# Patient Record
Sex: Male | Born: 1935 | Race: White | Hispanic: No | Marital: Married | Smoking: Former smoker
Health system: Southern US, Community
[De-identification: ages and names within clinical notes are randomized; demographics above are authoritative.]

## PROBLEM LIST (undated history)

## (undated) DIAGNOSIS — K589 Irritable bowel syndrome without diarrhea: Secondary | ICD-10-CM

## (undated) DIAGNOSIS — K219 Gastro-esophageal reflux disease without esophagitis: Secondary | ICD-10-CM

## (undated) DIAGNOSIS — H3530 Unspecified macular degeneration: Secondary | ICD-10-CM

## (undated) DIAGNOSIS — I10 Essential (primary) hypertension: Secondary | ICD-10-CM

## (undated) DIAGNOSIS — Z973 Presence of spectacles and contact lenses: Secondary | ICD-10-CM

## (undated) DIAGNOSIS — K2270 Barrett's esophagus without dysplasia: Secondary | ICD-10-CM

## (undated) DIAGNOSIS — E039 Hypothyroidism, unspecified: Secondary | ICD-10-CM

## (undated) DIAGNOSIS — E162 Hypoglycemia, unspecified: Secondary | ICD-10-CM

## (undated) DIAGNOSIS — M720 Palmar fascial fibromatosis [Dupuytren]: Secondary | ICD-10-CM

## (undated) DIAGNOSIS — C9000 Multiple myeloma not having achieved remission: Secondary | ICD-10-CM

## (undated) DIAGNOSIS — R718 Other abnormality of red blood cells: Secondary | ICD-10-CM

## (undated) DIAGNOSIS — E538 Deficiency of other specified B group vitamins: Secondary | ICD-10-CM

## (undated) DIAGNOSIS — E785 Hyperlipidemia, unspecified: Secondary | ICD-10-CM

## (undated) HISTORY — DX: Hypoglycemia, unspecified: E16.2

## (undated) HISTORY — DX: Deficiency of other specified B group vitamins: E53.8

## (undated) HISTORY — DX: Barrett's esophagus without dysplasia: K22.70

## (undated) HISTORY — DX: Irritable bowel syndrome, unspecified: K58.9

## (undated) HISTORY — DX: Hyperlipidemia, unspecified: E78.5

## (undated) HISTORY — PX: CATARACT EXTRACTION: SUR2

## (undated) HISTORY — PX: TONSILLECTOMY: SUR1361

## (undated) HISTORY — PX: COLONOSCOPY W/ POLYPECTOMY: SHX1380

## (undated) HISTORY — DX: Other abnormality of red blood cells: R71.8

## (undated) HISTORY — PX: ESOPHAGOGASTRODUODENOSCOPY: SHX1529

## (undated) HISTORY — DX: Palmar fascial fibromatosis (dupuytren): M72.0

## (undated) HISTORY — DX: Multiple myeloma not having achieved remission: C90.00

## (undated) HISTORY — PX: SKIN CANCER EXCISION: SHX779

## (undated) HISTORY — DX: Hypothyroidism, unspecified: E03.9

## (undated) HISTORY — DX: Unspecified macular degeneration: H35.30

## (undated) HISTORY — DX: Essential (primary) hypertension: I10

## (undated) HISTORY — DX: Gastro-esophageal reflux disease without esophagitis: K21.9

---

## 2010-05-22 ENCOUNTER — Ambulatory Visit (HOSPITAL_COMMUNITY): Admission: RE | Admit: 2010-05-22 | Discharge: 2010-05-22 | Payer: Self-pay | Admitting: Gastroenterology

## 2011-07-06 ENCOUNTER — Encounter (INDEPENDENT_AMBULATORY_CARE_PROVIDER_SITE_OTHER): Payer: Medicare Other | Admitting: Ophthalmology

## 2011-07-06 DIAGNOSIS — H353290 Exudative age-related macular degeneration, unspecified eye, stage unspecified: Secondary | ICD-10-CM

## 2011-07-06 DIAGNOSIS — H43819 Vitreous degeneration, unspecified eye: Secondary | ICD-10-CM

## 2011-07-06 DIAGNOSIS — H3530 Unspecified macular degeneration: Secondary | ICD-10-CM

## 2011-08-10 ENCOUNTER — Encounter (INDEPENDENT_AMBULATORY_CARE_PROVIDER_SITE_OTHER): Payer: Medicare Other | Admitting: Ophthalmology

## 2011-08-10 DIAGNOSIS — H353290 Exudative age-related macular degeneration, unspecified eye, stage unspecified: Secondary | ICD-10-CM

## 2011-08-10 DIAGNOSIS — H3530 Unspecified macular degeneration: Secondary | ICD-10-CM

## 2011-08-10 DIAGNOSIS — H43819 Vitreous degeneration, unspecified eye: Secondary | ICD-10-CM

## 2011-09-14 ENCOUNTER — Encounter (INDEPENDENT_AMBULATORY_CARE_PROVIDER_SITE_OTHER): Payer: Medicare Other | Admitting: Ophthalmology

## 2011-09-14 DIAGNOSIS — H43819 Vitreous degeneration, unspecified eye: Secondary | ICD-10-CM

## 2011-09-14 DIAGNOSIS — H3530 Unspecified macular degeneration: Secondary | ICD-10-CM

## 2011-09-14 DIAGNOSIS — H353290 Exudative age-related macular degeneration, unspecified eye, stage unspecified: Secondary | ICD-10-CM

## 2011-10-19 ENCOUNTER — Encounter (INDEPENDENT_AMBULATORY_CARE_PROVIDER_SITE_OTHER): Payer: Medicare Other | Admitting: Ophthalmology

## 2011-10-19 DIAGNOSIS — H43819 Vitreous degeneration, unspecified eye: Secondary | ICD-10-CM

## 2011-10-19 DIAGNOSIS — H353290 Exudative age-related macular degeneration, unspecified eye, stage unspecified: Secondary | ICD-10-CM

## 2011-10-19 DIAGNOSIS — H2510 Age-related nuclear cataract, unspecified eye: Secondary | ICD-10-CM

## 2011-10-19 DIAGNOSIS — H3530 Unspecified macular degeneration: Secondary | ICD-10-CM

## 2011-10-25 ENCOUNTER — Telehealth: Payer: Self-pay | Admitting: Oncology

## 2011-10-25 NOTE — Telephone Encounter (Signed)
lmonvm advising the pt to call me back to comfirm an appt in dec with dr Truett Perna for a new pt consult.

## 2011-11-26 ENCOUNTER — Ambulatory Visit: Payer: Medicare Other

## 2011-11-26 ENCOUNTER — Ambulatory Visit (HOSPITAL_BASED_OUTPATIENT_CLINIC_OR_DEPARTMENT_OTHER): Payer: Medicare Other | Admitting: Oncology

## 2011-11-26 ENCOUNTER — Encounter: Payer: Self-pay | Admitting: Oncology

## 2011-11-26 ENCOUNTER — Other Ambulatory Visit: Payer: Medicare Other | Admitting: Lab

## 2011-11-26 VITALS — BP 147/64 | HR 67 | Temp 97.4°F | Ht 67.5 in | Wt 181.1 lb

## 2011-11-26 DIAGNOSIS — D7589 Other specified diseases of blood and blood-forming organs: Secondary | ICD-10-CM

## 2011-11-26 DIAGNOSIS — D472 Monoclonal gammopathy: Secondary | ICD-10-CM

## 2011-11-26 DIAGNOSIS — G5790 Unspecified mononeuropathy of unspecified lower limb: Secondary | ICD-10-CM

## 2011-11-26 LAB — CBC WITH DIFFERENTIAL/PLATELET
Basophils Absolute: 0 10*3/uL (ref 0.0–0.1)
HCT: 38 % — ABNORMAL LOW (ref 38.4–49.9)
HGB: 13.2 g/dL (ref 13.0–17.1)
MONO#: 0.9 10*3/uL (ref 0.1–0.9)
NEUT#: 3.3 10*3/uL (ref 1.5–6.5)
NEUT%: 38.5 % — ABNORMAL LOW (ref 39.0–75.0)
RDW: 13.1 % (ref 11.0–14.6)
WBC: 8.6 10*3/uL (ref 4.0–10.3)
lymph#: 4.2 10*3/uL — ABNORMAL HIGH (ref 0.9–3.3)

## 2011-11-26 LAB — COMPREHENSIVE METABOLIC PANEL
AST: 39 U/L — ABNORMAL HIGH (ref 0–37)
Albumin: 3.6 g/dL (ref 3.5–5.2)
BUN: 14 mg/dL (ref 6–23)
Calcium: 9.6 mg/dL (ref 8.4–10.5)
Chloride: 100 mEq/L (ref 96–112)
Glucose, Bld: 102 mg/dL — ABNORMAL HIGH (ref 70–99)
Potassium: 3.6 mEq/L (ref 3.5–5.3)
Sodium: 135 mEq/L (ref 135–145)
Total Protein: 8.4 g/dL — ABNORMAL HIGH (ref 6.0–8.3)

## 2011-11-26 NOTE — Progress Notes (Signed)
Referring MD:  Candis Shine 77 y.o.  02-Sep-1934    Reason for Referral: Monoclonal gammopathy     HPI: He reports being diagnosed with a monoclonal protein while living in [place] in 2008. He reports being referred to an oncologist in [place]. The monoclonal protein has been followed with an observation approach. He has not undergone a diagnostic bone marrow biopsy.  He reports feeling well. We saw Dr. Pete Glatter on 10/13/2011 the serum IgG level was elevated at 2658 compared to a level of 2426 on 12/29/2010. He is referred for hematology evaluation. A skeletal survey on 01/01/2011 revealed no evidence for multiple myeloma..   Past Medical History  Diagnosis Date  . Hypertension   . Monoclonal paraproteinemia  2008  . Hyperlipemia   . GERD (gastroesophageal reflux disease)   . Irritable bowel syndrome (IBS)   . Thyroid disease     Hypothyroidism  . Dupuytren's contractures at hands    . Vitamin B12 deficiency   . Macular degeneration-maintained on Avastin therapy, right eye   . Barrett's esophagus    . History of "hypoglycemia"  . "Neuropathy" effecting the feet  . History of vitamin B 12 deficiency-maintained on B12 replacement  . History of "ventricular tachycardia" Past Surgical History  Procedure Date  . Skin cancer excision     Left ear-basal cell carcinoma   . Tonsillectomy     age 38 1/2  . Colonoscopy w/ polypectomy     at 77yo---benign    Family history: His father and a brother had lung cancer. They were smokers. A maternal on had lymphoma. 2 maternal uncles had pancreatic cancer. His mother is alive at age 60.   Current outpatient prescriptions:aspirin 81 MG tablet, Take 81 mg by mouth daily.  , Disp: , Rfl: ;  atenolol (TENORMIN) 50 MG tablet, Take 50 mg by mouth daily.  , Disp: , Rfl: ;  cyanocobalamin 2000 MCG tablet, Take 2,000 mcg by mouth daily.  , Disp: , Rfl: ;  fish oil-omega-3 fatty acids 1000 MG capsule, Take 1 g by mouth daily.  ,  Disp: , Rfl: ;  hydrochlorothiazide (HYDRODIURIL) 12.5 MG tablet, Take 12.5 mg by mouth daily.  , Disp: , Rfl:  hyoscyamine (LEVSIN, ANASPAZ) 0.125 MG tablet, Take 0.125 mg by mouth every 6 (six) hours as needed.  , Disp: , Rfl: ;  ibuprofen (ADVIL,MOTRIN) 800 MG tablet, Take 800 mg by mouth as needed.  , Disp: , Rfl: ;  levothyroxine (SYNTHROID, LEVOTHROID) 150 MCG tablet, Take 150 mcg by mouth daily.  , Disp: , Rfl: ;  mineral oil liquid, Place 30 mLs in ear(s) once a week. Right ear , Disp: , Rfl:  mometasone (NASONEX) 50 MCG/ACT nasal spray, Place 2 sprays into the nose daily. 50MCG/ACT Each nostril , Disp: , Rfl: ;  Multiple Vitamins-Minerals (PRESERVISION AREDS PO), Take 1 capsule by mouth daily.  , Disp: , Rfl: ;  pantoprazole (PROTONIX) 40 MG tablet, Take 40 mg by mouth daily.  , Disp: , Rfl: ;  simvastatin (ZOCOR) 20 MG tablet, Take 20 mg by mouth at bedtime.  , Disp: , Rfl:   Allergies: No Known Allergies  Social History: He lives with his wife in [place]. He is retired after working for [employer]. He quit smoking cigarettes in 1989. He has 2-3 lyquor drinks per day. 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 He knows of no unusual exposures. He has no transfusion history. He denies risk factors for HIV and  hepatitis.   ROS:   Positives include: Numbness and burning in the feet for the past 4-5 years, decreased vision in the right eye secondary to macular degeneration-improved with Avastin, "seborrhea "at the head-improved following a "peel "procedure, Dupuytren's contractures at the hands bilaterally.  A complete ROS was otherwise negative.  Physical Exam:  Blood pressure 147/64, pulse 67, temperature 97.4 F (36.3 C), temperature source Oral, height 5' 7.5" (1.715 m), weight 181 lb 1.6 oz (82.146 kg).  HEENT: Oropharynx without visible mass, neck without mass Lungs:  Clear bilaterally Cardiac: Regular rate and rhythm Abdomen: No hepatosplenomegaly. Nontender. No mass. GU: The testes  appear atrophied. No mass.  Vascular: Chronic stasis change at the low leg bilaterally with Brown discoloration at the pretibial areas on the left greater than right Lymph nodes: No cervical, supraclavicular, axillary, or inguinal lymph node Neurologic: Alert and oriented. The motor exam appears grossly intact throughout. The deep tendon reflexes are 2+ and symmetric at the biceps and knee Skin: Few telangiectasias at the upper chest. Musculoskeletal: Contractures at several of the finger joints bilaterally. Breasts: Bilateral gynecomastia   LAB:  CBC  Lab Results  Component Value Date   WBC 8.6 11/26/2011   HGB 13.2 11/26/2011   HCT 38.0* 11/26/2011   MCV 101.4* 11/26/2011   PLT 186 11/26/2011   review of the peripheral blood smear: The platelets appear normal in number. The white cells are mature and there is no mild not in this population. I see no circulating plasma cells. There is a moderate degree of variation in red cell size.  CMP      Component Value Date/Time   NA 135 11/26/2011 1525   K 3.6 11/26/2011 1525   CL 100 11/26/2011 1525   CO2 28 11/26/2011 1525   GLUCOSE 102* 11/26/2011 1525   BUN 14 11/26/2011 1525   CREATININE 0.83 11/26/2011 1525   CALCIUM 9.6 11/26/2011 1525   PROT 8.4* 11/26/2011 1525   ALBUMIN 3.6 11/26/2011 1525   AST 39* 11/26/2011 1525   ALT 31 11/26/2011 1525   ALKPHOS 55 11/26/2011 1525   BILITOT 0.6 11/26/2011 1525      Assessment/Plan:   1. Elevated IgG level with a monoclonal protein demonstrated on previous serum protein electrophoresis studies.  2. Red cell macrocytosis  3. Moderate alcohol use  4. "Neuropathy "of the feet   Disposition:   Mr. Olivar is referred for evaluation of a serum monoclonal protein. He reports this has been noted since 2008. I suspect the protein represents a monoclonal gammopathy of unknown significance versus "smoldering" multiple myeloma. There is no clinical evidence of a lymphoproliferative  disorder.  The lack of anemia, the normal IgM/IgA levels on recent lab study, and the negative skeletal survey argue against a diagnosis of multiple myeloma.  The "neuropathy "is most likely related to a benign condition, but myeloma-related neuropathy should be considered if this progresses.  The Red cell macrocytosis could be related to a plasma cell dyscrasia, but I think this is more likely due to alcohol use. He has physical exam findings today that may suggest underlying liver disease.  We obtained a serum protein electrophoresis/immunofixation and serum free light chain analysis today. I do not recommend a bone marrow biopsy at present. He would like to continue clinical followup with Dr. Pete Glatter. I recommend Dr. Pete Glatter obtain a a CBC, chemistry panel, and serum protein electrophoresis approximately every 6 months. Mr. Mcelrath understands he could develop multiple myeloma in the future. I will be  glad to see him back if he develops a progressive rise in the serum M spike or new clinical/laboratory findings to suggest multiple myeloma.    Ceazia Harb BRADLEY 11/26/2011, 5:38 PM

## 2011-11-30 ENCOUNTER — Encounter (INDEPENDENT_AMBULATORY_CARE_PROVIDER_SITE_OTHER): Payer: Medicare Other | Admitting: Ophthalmology

## 2011-11-30 LAB — SPEP & IFE WITH QIG
Albumin ELP: 49.7 % — ABNORMAL LOW (ref 55.8–66.1)
Alpha-1-Globulin: 3.5 % (ref 2.9–4.9)
Alpha-2-Globulin: 9.6 % (ref 7.1–11.8)
Gamma Globulin: 28.2 % — ABNORMAL HIGH (ref 11.1–18.8)
IgM, Serum: 121 mg/dL (ref 41–251)

## 2011-11-30 LAB — KAPPA/LAMBDA LIGHT CHAINS
Kappa:Lambda Ratio: 2.08 — ABNORMAL HIGH (ref 0.26–1.65)
Lambda Free Lght Chn: 1.21 mg/dL (ref 0.57–2.63)

## 2011-12-02 ENCOUNTER — Telehealth: Payer: Self-pay | Admitting: *Deleted

## 2011-12-02 NOTE — Telephone Encounter (Signed)
Received call from pt reporting he was called by this office to schedule labs and radiation. There is no notation of this call in pt's chart. There is no indication for XRT for this pt. Informed pt that Dr. Truett Perna suggests observation and labs every six months in his case. He can have these labs done with Dr. Pete Glatter. Pt verbalized understanding.

## 2011-12-03 ENCOUNTER — Encounter (INDEPENDENT_AMBULATORY_CARE_PROVIDER_SITE_OTHER): Payer: Medicare Other | Admitting: Ophthalmology

## 2011-12-03 DIAGNOSIS — H43819 Vitreous degeneration, unspecified eye: Secondary | ICD-10-CM

## 2011-12-03 DIAGNOSIS — H353290 Exudative age-related macular degeneration, unspecified eye, stage unspecified: Secondary | ICD-10-CM

## 2011-12-03 DIAGNOSIS — H2510 Age-related nuclear cataract, unspecified eye: Secondary | ICD-10-CM

## 2011-12-03 DIAGNOSIS — H3530 Unspecified macular degeneration: Secondary | ICD-10-CM

## 2012-01-14 ENCOUNTER — Encounter (INDEPENDENT_AMBULATORY_CARE_PROVIDER_SITE_OTHER): Payer: Medicare Other | Admitting: Ophthalmology

## 2012-01-14 DIAGNOSIS — H43819 Vitreous degeneration, unspecified eye: Secondary | ICD-10-CM

## 2012-01-14 DIAGNOSIS — H3530 Unspecified macular degeneration: Secondary | ICD-10-CM

## 2012-01-14 DIAGNOSIS — H353290 Exudative age-related macular degeneration, unspecified eye, stage unspecified: Secondary | ICD-10-CM

## 2012-01-14 DIAGNOSIS — H2510 Age-related nuclear cataract, unspecified eye: Secondary | ICD-10-CM

## 2012-03-03 ENCOUNTER — Encounter (INDEPENDENT_AMBULATORY_CARE_PROVIDER_SITE_OTHER): Payer: Medicare Other | Admitting: Ophthalmology

## 2012-03-03 DIAGNOSIS — H43819 Vitreous degeneration, unspecified eye: Secondary | ICD-10-CM

## 2012-03-03 DIAGNOSIS — H2510 Age-related nuclear cataract, unspecified eye: Secondary | ICD-10-CM

## 2012-03-03 DIAGNOSIS — H3530 Unspecified macular degeneration: Secondary | ICD-10-CM

## 2012-03-03 DIAGNOSIS — H353290 Exudative age-related macular degeneration, unspecified eye, stage unspecified: Secondary | ICD-10-CM

## 2012-04-21 ENCOUNTER — Encounter (INDEPENDENT_AMBULATORY_CARE_PROVIDER_SITE_OTHER): Payer: Medicare Other | Admitting: Ophthalmology

## 2012-04-21 DIAGNOSIS — H43819 Vitreous degeneration, unspecified eye: Secondary | ICD-10-CM

## 2012-04-21 DIAGNOSIS — H2510 Age-related nuclear cataract, unspecified eye: Secondary | ICD-10-CM

## 2012-04-21 DIAGNOSIS — H3530 Unspecified macular degeneration: Secondary | ICD-10-CM

## 2012-04-21 DIAGNOSIS — H353290 Exudative age-related macular degeneration, unspecified eye, stage unspecified: Secondary | ICD-10-CM

## 2012-06-02 ENCOUNTER — Encounter (INDEPENDENT_AMBULATORY_CARE_PROVIDER_SITE_OTHER): Payer: Medicare Other | Admitting: Ophthalmology

## 2012-06-02 DIAGNOSIS — H2510 Age-related nuclear cataract, unspecified eye: Secondary | ICD-10-CM

## 2012-06-02 DIAGNOSIS — H43819 Vitreous degeneration, unspecified eye: Secondary | ICD-10-CM

## 2012-06-02 DIAGNOSIS — H3530 Unspecified macular degeneration: Secondary | ICD-10-CM

## 2012-06-02 DIAGNOSIS — H353290 Exudative age-related macular degeneration, unspecified eye, stage unspecified: Secondary | ICD-10-CM

## 2012-07-14 ENCOUNTER — Encounter (INDEPENDENT_AMBULATORY_CARE_PROVIDER_SITE_OTHER): Payer: Medicare Other | Admitting: Ophthalmology

## 2012-07-14 DIAGNOSIS — H4320 Crystalline deposits in vitreous body, unspecified eye: Secondary | ICD-10-CM

## 2012-07-14 DIAGNOSIS — H3530 Unspecified macular degeneration: Secondary | ICD-10-CM

## 2012-07-14 DIAGNOSIS — H43819 Vitreous degeneration, unspecified eye: Secondary | ICD-10-CM

## 2012-07-14 DIAGNOSIS — H353290 Exudative age-related macular degeneration, unspecified eye, stage unspecified: Secondary | ICD-10-CM

## 2012-07-14 DIAGNOSIS — H35039 Hypertensive retinopathy, unspecified eye: Secondary | ICD-10-CM

## 2012-07-14 DIAGNOSIS — I10 Essential (primary) hypertension: Secondary | ICD-10-CM

## 2012-07-14 DIAGNOSIS — H2510 Age-related nuclear cataract, unspecified eye: Secondary | ICD-10-CM

## 2012-08-25 ENCOUNTER — Encounter (INDEPENDENT_AMBULATORY_CARE_PROVIDER_SITE_OTHER): Payer: Medicare Other | Admitting: Ophthalmology

## 2012-08-25 DIAGNOSIS — H2510 Age-related nuclear cataract, unspecified eye: Secondary | ICD-10-CM

## 2012-08-25 DIAGNOSIS — H3530 Unspecified macular degeneration: Secondary | ICD-10-CM

## 2012-08-25 DIAGNOSIS — H35039 Hypertensive retinopathy, unspecified eye: Secondary | ICD-10-CM

## 2012-08-25 DIAGNOSIS — H43819 Vitreous degeneration, unspecified eye: Secondary | ICD-10-CM

## 2012-08-25 DIAGNOSIS — H353290 Exudative age-related macular degeneration, unspecified eye, stage unspecified: Secondary | ICD-10-CM

## 2012-08-25 DIAGNOSIS — I10 Essential (primary) hypertension: Secondary | ICD-10-CM

## 2012-09-19 ENCOUNTER — Other Ambulatory Visit: Payer: Self-pay | Admitting: Dermatology

## 2012-10-07 ENCOUNTER — Encounter (INDEPENDENT_AMBULATORY_CARE_PROVIDER_SITE_OTHER): Payer: Medicare Other | Admitting: Ophthalmology

## 2012-10-07 DIAGNOSIS — H3530 Unspecified macular degeneration: Secondary | ICD-10-CM

## 2012-10-07 DIAGNOSIS — H2510 Age-related nuclear cataract, unspecified eye: Secondary | ICD-10-CM

## 2012-10-07 DIAGNOSIS — I10 Essential (primary) hypertension: Secondary | ICD-10-CM

## 2012-10-07 DIAGNOSIS — H353290 Exudative age-related macular degeneration, unspecified eye, stage unspecified: Secondary | ICD-10-CM

## 2012-10-07 DIAGNOSIS — H35039 Hypertensive retinopathy, unspecified eye: Secondary | ICD-10-CM

## 2012-10-07 DIAGNOSIS — H43819 Vitreous degeneration, unspecified eye: Secondary | ICD-10-CM

## 2012-11-25 ENCOUNTER — Encounter (INDEPENDENT_AMBULATORY_CARE_PROVIDER_SITE_OTHER): Payer: Medicare Other | Admitting: Ophthalmology

## 2012-11-28 ENCOUNTER — Encounter (INDEPENDENT_AMBULATORY_CARE_PROVIDER_SITE_OTHER): Payer: Medicare Other | Admitting: Ophthalmology

## 2012-11-28 DIAGNOSIS — H353290 Exudative age-related macular degeneration, unspecified eye, stage unspecified: Secondary | ICD-10-CM

## 2012-11-28 DIAGNOSIS — H35039 Hypertensive retinopathy, unspecified eye: Secondary | ICD-10-CM

## 2012-11-28 DIAGNOSIS — H2510 Age-related nuclear cataract, unspecified eye: Secondary | ICD-10-CM

## 2012-11-28 DIAGNOSIS — H43819 Vitreous degeneration, unspecified eye: Secondary | ICD-10-CM

## 2012-11-28 DIAGNOSIS — I10 Essential (primary) hypertension: Secondary | ICD-10-CM

## 2012-11-28 DIAGNOSIS — H3530 Unspecified macular degeneration: Secondary | ICD-10-CM

## 2013-01-16 ENCOUNTER — Encounter (INDEPENDENT_AMBULATORY_CARE_PROVIDER_SITE_OTHER): Payer: Medicare Other | Admitting: Ophthalmology

## 2013-01-16 DIAGNOSIS — H3530 Unspecified macular degeneration: Secondary | ICD-10-CM

## 2013-01-16 DIAGNOSIS — H2510 Age-related nuclear cataract, unspecified eye: Secondary | ICD-10-CM

## 2013-01-16 DIAGNOSIS — H353290 Exudative age-related macular degeneration, unspecified eye, stage unspecified: Secondary | ICD-10-CM

## 2013-01-16 DIAGNOSIS — I10 Essential (primary) hypertension: Secondary | ICD-10-CM

## 2013-01-16 DIAGNOSIS — H43819 Vitreous degeneration, unspecified eye: Secondary | ICD-10-CM

## 2013-01-16 DIAGNOSIS — H35039 Hypertensive retinopathy, unspecified eye: Secondary | ICD-10-CM

## 2013-03-06 ENCOUNTER — Encounter (INDEPENDENT_AMBULATORY_CARE_PROVIDER_SITE_OTHER): Payer: Medicare Other | Admitting: Ophthalmology

## 2013-03-06 DIAGNOSIS — I10 Essential (primary) hypertension: Secondary | ICD-10-CM

## 2013-03-06 DIAGNOSIS — H43819 Vitreous degeneration, unspecified eye: Secondary | ICD-10-CM

## 2013-03-06 DIAGNOSIS — H3530 Unspecified macular degeneration: Secondary | ICD-10-CM

## 2013-03-06 DIAGNOSIS — H35039 Hypertensive retinopathy, unspecified eye: Secondary | ICD-10-CM

## 2013-03-06 DIAGNOSIS — H353290 Exudative age-related macular degeneration, unspecified eye, stage unspecified: Secondary | ICD-10-CM

## 2013-04-13 ENCOUNTER — Encounter (INDEPENDENT_AMBULATORY_CARE_PROVIDER_SITE_OTHER): Payer: Medicare Other | Admitting: Ophthalmology

## 2013-04-13 DIAGNOSIS — H35039 Hypertensive retinopathy, unspecified eye: Secondary | ICD-10-CM

## 2013-04-13 DIAGNOSIS — H353290 Exudative age-related macular degeneration, unspecified eye, stage unspecified: Secondary | ICD-10-CM

## 2013-04-13 DIAGNOSIS — I10 Essential (primary) hypertension: Secondary | ICD-10-CM

## 2013-04-13 DIAGNOSIS — H43819 Vitreous degeneration, unspecified eye: Secondary | ICD-10-CM

## 2013-04-13 DIAGNOSIS — H3530 Unspecified macular degeneration: Secondary | ICD-10-CM

## 2013-04-13 DIAGNOSIS — H2510 Age-related nuclear cataract, unspecified eye: Secondary | ICD-10-CM

## 2013-05-23 ENCOUNTER — Encounter (INDEPENDENT_AMBULATORY_CARE_PROVIDER_SITE_OTHER): Payer: Medicare Other | Admitting: Ophthalmology

## 2013-05-24 ENCOUNTER — Encounter (INDEPENDENT_AMBULATORY_CARE_PROVIDER_SITE_OTHER): Payer: Medicare Other | Admitting: Ophthalmology

## 2013-05-24 DIAGNOSIS — H3530 Unspecified macular degeneration: Secondary | ICD-10-CM

## 2013-05-24 DIAGNOSIS — I10 Essential (primary) hypertension: Secondary | ICD-10-CM

## 2013-05-24 DIAGNOSIS — H353290 Exudative age-related macular degeneration, unspecified eye, stage unspecified: Secondary | ICD-10-CM

## 2013-05-24 DIAGNOSIS — H43819 Vitreous degeneration, unspecified eye: Secondary | ICD-10-CM

## 2013-05-24 DIAGNOSIS — H35039 Hypertensive retinopathy, unspecified eye: Secondary | ICD-10-CM

## 2013-05-24 DIAGNOSIS — H2510 Age-related nuclear cataract, unspecified eye: Secondary | ICD-10-CM

## 2013-05-25 ENCOUNTER — Encounter (INDEPENDENT_AMBULATORY_CARE_PROVIDER_SITE_OTHER): Payer: Medicare Other | Admitting: Ophthalmology

## 2013-06-27 ENCOUNTER — Ambulatory Visit (HOSPITAL_BASED_OUTPATIENT_CLINIC_OR_DEPARTMENT_OTHER): Payer: Medicare Other | Admitting: Oncology

## 2013-06-27 VITALS — BP 149/48 | HR 64 | Temp 98.1°F | Resp 20 | Ht 67.5 in | Wt 181.7 lb

## 2013-06-27 DIAGNOSIS — D472 Monoclonal gammopathy: Secondary | ICD-10-CM

## 2013-06-27 NOTE — Progress Notes (Signed)
   [facility]    OFFICE PROGRESS NOTE   INTERVAL HISTORY:   [name] has been followed by Dr. Pete Glatter for the serum monoclonal protein. On 05/24/2013 the IgG returned more elevated at 3747. He is referred for hematology evaluation.  He reports feeling well. No recent infection. No pain. He exercises regularly. Good appetite.  Objective:  Vital signs in last 24 hours:  Blood pressure 149/48, pulse 64, temperature 98.1 F (36.7 C), temperature source Oral, resp. rate 20, height 5' 7.5" (1.715 m), weight 181 lb 11.2 oz (82.419 kg).    HEENT: Neck without mass Lymphatics: No cervical, supra-clavicular, axillary, or inguinal node Resp: Lungs clear bilaterally Cardio: Regular rate and rhythm GI: No hepatosplenomegaly Vascular: Trace edema at the left greater than right lower leg and ankle   Lab Results: 05/24/2013-chemotherapy 13, MCV 102.8, white count 7.3, lymphocytes 3.5, platelets 164,000, ANC 2.5, BUN 9, creatinine 0.71, total protein 8.5, albumin 3.7, calcium 9.0, alkaline phosphatase 41 IgA 115, IgM 104  Medications: I have reviewed the patient's current medications.  Assessment/Plan: 1. Elevated IgG level with a monoclonal IgG kappa protein demonstrated on a serum protein electrophoresis December 2012 2. Red cell macrocytosis -likely secondary to alcohol use versus liver disease 3. Moderate alcohol use ,? Underlying liver disease 4. "Neuropathy "of the feet  5. Mildly elevated lymphocyte count-stable compared to December 2012   Disposition:  The serum IgG level was higher in June of 2014 compared to December of 2012. There is no other laboratory or clinical evidence for progression to multiple myeloma. I suspect he has a monoclonal gammopathy of unknown significance. There is no evidence of a lymphoproliferative disorder.  I do not recommend a bone marrow biopsy at present. If he develops anemia, hypercalcemia, an elevated creatinine, or a  rapidly rising M spike I will recommend a bone marrow biopsy and metastatic bone survey.  Mr. Pen would like to continue followup with Dr. Pete Glatter. I recommend a repeat IgG level, serum protein electrophoresis, CBC, and chemistry panel in approximately 4 months. I will be glad to see him back if he develops a new laboratory abnormality or a rapid rise in the serum M spike (e.g. Doubling).   Thornton Papas, MD  06/27/2013  4:17 PM

## 2013-06-28 ENCOUNTER — Encounter (INDEPENDENT_AMBULATORY_CARE_PROVIDER_SITE_OTHER): Payer: Medicare Other | Admitting: Ophthalmology

## 2013-06-28 ENCOUNTER — Telehealth: Payer: Self-pay | Admitting: Oncology

## 2013-06-28 DIAGNOSIS — H35039 Hypertensive retinopathy, unspecified eye: Secondary | ICD-10-CM

## 2013-06-28 DIAGNOSIS — H3530 Unspecified macular degeneration: Secondary | ICD-10-CM

## 2013-06-28 DIAGNOSIS — I10 Essential (primary) hypertension: Secondary | ICD-10-CM

## 2013-06-28 DIAGNOSIS — H353290 Exudative age-related macular degeneration, unspecified eye, stage unspecified: Secondary | ICD-10-CM

## 2013-06-28 NOTE — Telephone Encounter (Signed)
Added FS for today @ 4pm - per desk nurse pt aware.

## 2013-06-28 NOTE — Telephone Encounter (Signed)
Previous phone note entered under FS entered in error. Per 7/15 pof f/u TBA.

## 2013-08-02 ENCOUNTER — Encounter (INDEPENDENT_AMBULATORY_CARE_PROVIDER_SITE_OTHER): Payer: Medicare Other | Admitting: Ophthalmology

## 2013-08-02 DIAGNOSIS — I10 Essential (primary) hypertension: Secondary | ICD-10-CM

## 2013-08-02 DIAGNOSIS — H43819 Vitreous degeneration, unspecified eye: Secondary | ICD-10-CM

## 2013-08-02 DIAGNOSIS — H35039 Hypertensive retinopathy, unspecified eye: Secondary | ICD-10-CM

## 2013-08-02 DIAGNOSIS — H353290 Exudative age-related macular degeneration, unspecified eye, stage unspecified: Secondary | ICD-10-CM

## 2013-08-02 DIAGNOSIS — H3530 Unspecified macular degeneration: Secondary | ICD-10-CM

## 2013-09-06 ENCOUNTER — Encounter (INDEPENDENT_AMBULATORY_CARE_PROVIDER_SITE_OTHER): Payer: Medicare Other | Admitting: Ophthalmology

## 2013-09-06 DIAGNOSIS — I10 Essential (primary) hypertension: Secondary | ICD-10-CM

## 2013-09-06 DIAGNOSIS — H3530 Unspecified macular degeneration: Secondary | ICD-10-CM

## 2013-09-06 DIAGNOSIS — H353290 Exudative age-related macular degeneration, unspecified eye, stage unspecified: Secondary | ICD-10-CM

## 2013-09-06 DIAGNOSIS — H35039 Hypertensive retinopathy, unspecified eye: Secondary | ICD-10-CM

## 2013-09-06 DIAGNOSIS — H43819 Vitreous degeneration, unspecified eye: Secondary | ICD-10-CM

## 2013-10-11 ENCOUNTER — Encounter (INDEPENDENT_AMBULATORY_CARE_PROVIDER_SITE_OTHER): Payer: Medicare Other | Admitting: Ophthalmology

## 2013-10-11 DIAGNOSIS — I10 Essential (primary) hypertension: Secondary | ICD-10-CM

## 2013-10-11 DIAGNOSIS — H43819 Vitreous degeneration, unspecified eye: Secondary | ICD-10-CM

## 2013-10-11 DIAGNOSIS — H353290 Exudative age-related macular degeneration, unspecified eye, stage unspecified: Secondary | ICD-10-CM

## 2013-10-11 DIAGNOSIS — H3530 Unspecified macular degeneration: Secondary | ICD-10-CM

## 2013-10-11 DIAGNOSIS — H35039 Hypertensive retinopathy, unspecified eye: Secondary | ICD-10-CM

## 2013-10-11 DIAGNOSIS — H2510 Age-related nuclear cataract, unspecified eye: Secondary | ICD-10-CM

## 2013-11-16 ENCOUNTER — Encounter (INDEPENDENT_AMBULATORY_CARE_PROVIDER_SITE_OTHER): Payer: Medicare Other | Admitting: Ophthalmology

## 2013-11-16 DIAGNOSIS — I10 Essential (primary) hypertension: Secondary | ICD-10-CM

## 2013-11-16 DIAGNOSIS — H35039 Hypertensive retinopathy, unspecified eye: Secondary | ICD-10-CM

## 2013-11-16 DIAGNOSIS — H3530 Unspecified macular degeneration: Secondary | ICD-10-CM

## 2013-11-16 DIAGNOSIS — H353290 Exudative age-related macular degeneration, unspecified eye, stage unspecified: Secondary | ICD-10-CM

## 2013-11-27 ENCOUNTER — Other Ambulatory Visit: Payer: Self-pay | Admitting: Geriatric Medicine

## 2013-11-27 DIAGNOSIS — Z139 Encounter for screening, unspecified: Secondary | ICD-10-CM

## 2013-11-29 ENCOUNTER — Ambulatory Visit: Payer: Medicare Other

## 2013-12-18 ENCOUNTER — Ambulatory Visit
Admission: RE | Admit: 2013-12-18 | Discharge: 2013-12-18 | Disposition: A | Discharge status: Home / Self Care | Payer: Medicare Other | Source: Ambulatory Visit | Attending: Geriatric Medicine | Admitting: Geriatric Medicine

## 2013-12-18 ENCOUNTER — Other Ambulatory Visit: Payer: Self-pay | Admitting: Geriatric Medicine

## 2013-12-18 DIAGNOSIS — Z139 Encounter for screening, unspecified: Secondary | ICD-10-CM

## 2013-12-20 ENCOUNTER — Other Ambulatory Visit: Payer: Self-pay | Admitting: Geriatric Medicine

## 2013-12-20 ENCOUNTER — Ambulatory Visit
Admission: RE | Admit: 2013-12-20 | Discharge: 2013-12-20 | Disposition: A | Discharge status: Home / Self Care | Payer: Medicare Other | Source: Ambulatory Visit | Attending: Geriatric Medicine | Admitting: Geriatric Medicine

## 2013-12-20 DIAGNOSIS — R0600 Dyspnea, unspecified: Secondary | ICD-10-CM

## 2013-12-21 ENCOUNTER — Telehealth: Payer: Self-pay | Admitting: Oncology

## 2013-12-21 ENCOUNTER — Telehealth: Payer: Self-pay | Admitting: *Deleted

## 2013-12-21 NOTE — Telephone Encounter (Signed)
Labs/ office notes reviewed by Dr. Benay Spice. Order sent to schedulers for 1 month office visit. No labs needed.

## 2013-12-21 NOTE — Telephone Encounter (Signed)
MEDICAL RECORDS RECEIVED GIVEN TO DESK NURSE.

## 2013-12-21 NOTE — Telephone Encounter (Signed)
Call from [place] with Dr. Felipa Eth: Pt has been diagnosed with anemia and thrombocytopenia. Dr. Felipa Eth would like pt to see Dr. Benay Spice for evaluation. They will fax labs to office.

## 2013-12-22 ENCOUNTER — Telehealth: Payer: Self-pay | Admitting: Oncology

## 2013-12-22 NOTE — Telephone Encounter (Signed)
S/w the pt and he is aware of his appt on 12/25/2013@3 :30pm.

## 2013-12-25 ENCOUNTER — Ambulatory Visit (HOSPITAL_BASED_OUTPATIENT_CLINIC_OR_DEPARTMENT_OTHER): Payer: Medicare Other | Admitting: Oncology

## 2013-12-25 ENCOUNTER — Encounter (INDEPENDENT_AMBULATORY_CARE_PROVIDER_SITE_OTHER): Payer: Self-pay

## 2013-12-25 ENCOUNTER — Other Ambulatory Visit: Payer: Self-pay | Admitting: *Deleted

## 2013-12-25 VITALS — BP 151/71 | HR 67 | Temp 98.6°F | Resp 18 | Ht 67.5 in | Wt 182.2 lb

## 2013-12-25 DIAGNOSIS — G5790 Unspecified mononeuropathy of unspecified lower limb: Secondary | ICD-10-CM

## 2013-12-25 DIAGNOSIS — D472 Monoclonal gammopathy: Secondary | ICD-10-CM

## 2013-12-25 DIAGNOSIS — D72820 Lymphocytosis (symptomatic): Secondary | ICD-10-CM

## 2013-12-25 DIAGNOSIS — D696 Thrombocytopenia, unspecified: Secondary | ICD-10-CM

## 2013-12-25 DIAGNOSIS — D649 Anemia, unspecified: Secondary | ICD-10-CM

## 2013-12-25 DIAGNOSIS — D7589 Other specified diseases of blood and blood-forming organs: Secondary | ICD-10-CM

## 2013-12-25 NOTE — Progress Notes (Addendum)
   [facility]    OFFICE PROGRESS NOTE Feels well  INTERVAL HISTORY:   Mr. Bartling is referred back to the hematology clinic by Dr. Felipa Eth. Laboratory studies on 12/20/1998 15,000 hemoglobin mildly low at 12.9, platelets 117,000, MCV 106.6, and the lymphocyte count was mildly elevated at 4.8. A chemistry panel was unremarkable. The IgG was more elevated (I do not have this result available today).  Mr. Mozer feels well. No fever, night sweats, or anorexia. His wife has noted he has malaise. No pain. He currently has 2 liquor drinks per day. He denies neuropathy symptoms at present.  Objective:  Vital signs in last 24 hours:  Blood pressure 151/71, pulse 67, temperature 98.6 F (37 C), temperature source Oral, resp. rate 18, height 5' 7.5" (1.715 m), weight 182 lb 3.2 oz (82.645 kg).    HEENT: Neck without mass Lymphatics: No cervical, supraclavicular, axillary, or inguinal nodes Resp: Lungs clear bilaterally Cardio: Regular rate and rhythm GI: No hepatosplenomegaly Vascular: No leg edema   Lab Results:  Labs ordered for 12/27/2013   Medications: I have reviewed the patient's current medications.  Assessment/Plan: 1. Elevated IgG level with a monoclonal protein demonstrated on previous serum protein electrophoresis studies. The monoclonal gammopathy dates to 2008 2. Red cell macrocytosis  3. Moderate alcohol use  4. "Neuropathy "of the feet  5. Mild lymphocytosis    Disposition:  He is referred for hematology evaluation after developing mild anemia and thrombocytopenia in the setting of a serum monoclonal IgG protein. There is no clinical evidence of a lymphoproliferative disorder or multiple myeloma.  We will obtain additional laboratory studies to include a CBC, blood smear, serum protein electrophoresis, serum free light chain analysis, vitamin B12 level, and beta-2 microglobulin level. We will consider proceeding with  flow cytometry on the  peripheral blood or a diagnostic bone marrow biopsy pending the above results.  We will be in contact with Mr. Salehi and Dr. Felipa Eth when these results are available. I discussed the case with his daughter by telephone today. He would like to continue clinical followup with Dr. Felipa Eth. We will see him here again as indicated.   [name], MD  12/25/2013  4:03 PM   Addendum: I reviewed the peripheral blood smear from 12/27/2013-the platelet number appears normal. The red cell morphology is unremarkable. There is an increased number of lymphocytes with a few plasmacytoid lymphocytes.  I sent the peripheral blood for flow cytometry. We will wait on the remaining laboratory studies to decide on the indication for a bone marrow biopsy and followup in the hematology clinic.

## 2013-12-27 ENCOUNTER — Other Ambulatory Visit (HOSPITAL_COMMUNITY)
Admission: RE | Admit: 2013-12-27 | Discharge: 2013-12-27 | Disposition: A | Discharge status: Home / Self Care | Payer: Medicare Other | Source: Ambulatory Visit | Attending: Oncology | Admitting: Oncology

## 2013-12-27 ENCOUNTER — Other Ambulatory Visit (HOSPITAL_BASED_OUTPATIENT_CLINIC_OR_DEPARTMENT_OTHER): Payer: Medicare Other

## 2013-12-27 DIAGNOSIS — D649 Anemia, unspecified: Secondary | ICD-10-CM

## 2013-12-27 DIAGNOSIS — R7989 Other specified abnormal findings of blood chemistry: Secondary | ICD-10-CM | POA: Insufficient documentation

## 2013-12-27 DIAGNOSIS — D472 Monoclonal gammopathy: Secondary | ICD-10-CM

## 2013-12-27 DIAGNOSIS — D696 Thrombocytopenia, unspecified: Secondary | ICD-10-CM

## 2013-12-27 LAB — CBC WITH DIFFERENTIAL/PLATELET
BASO%: 0.5 % (ref 0.0–2.0)
Basophils Absolute: 0 10*3/uL (ref 0.0–0.1)
EOS%: 3 % (ref 0.0–7.0)
Eosinophils Absolute: 0.2 10*3/uL (ref 0.0–0.5)
HEMATOCRIT: 35.8 % — AB (ref 38.4–49.9)
HEMOGLOBIN: 12.6 g/dL — AB (ref 13.0–17.1)
LYMPH%: 52.9 % — AB (ref 14.0–49.0)
MCH: 37.9 pg — AB (ref 27.2–33.4)
MCHC: 35.1 g/dL (ref 32.0–36.0)
MCV: 107.9 fL — AB (ref 79.3–98.0)
MONO#: 0.9 10*3/uL (ref 0.1–0.9)
MONO%: 11.9 % (ref 0.0–14.0)
NEUT%: 31.7 % — AB (ref 39.0–75.0)
NEUTROS ABS: 2.5 10*3/uL (ref 1.5–6.5)
PLATELETS: 127 10*3/uL — AB (ref 140–400)
RBC: 3.32 10*6/uL — ABNORMAL LOW (ref 4.20–5.82)
RDW: 12.9 % (ref 11.0–14.6)
WBC: 7.8 10*3/uL (ref 4.0–10.3)
lymph#: 4.1 10*3/uL — ABNORMAL HIGH (ref 0.9–3.3)

## 2013-12-27 LAB — CHCC SMEAR

## 2013-12-27 LAB — TECHNOLOGIST REVIEW

## 2013-12-28 ENCOUNTER — Other Ambulatory Visit: Payer: Self-pay | Admitting: Oncology

## 2013-12-28 DIAGNOSIS — D472 Monoclonal gammopathy: Secondary | ICD-10-CM

## 2013-12-29 LAB — KAPPA/LAMBDA LIGHT CHAINS
Kappa free light chain: 4.93 mg/dL — ABNORMAL HIGH (ref 0.33–1.94)
Kappa:Lambda Ratio: 2.99 — ABNORMAL HIGH (ref 0.26–1.65)
Lambda Free Lght Chn: 1.65 mg/dL (ref 0.57–2.63)

## 2013-12-29 LAB — VITAMIN B12: VITAMIN B 12: 431 pg/mL (ref 211–911)

## 2013-12-29 LAB — PROTEIN ELECTROPHORESIS, SERUM
ALBUMIN ELP: 45.1 % — AB (ref 55.8–66.1)
Alpha-1-Globulin: 3.3 % (ref 2.9–4.9)
Alpha-2-Globulin: 8.5 % (ref 7.1–11.8)
BETA 2: 2.8 % — AB (ref 3.2–6.5)
Beta Globulin: 4.8 % (ref 4.7–7.2)
GAMMA GLOBULIN: 35.5 % — AB (ref 11.1–18.8)
M-Spike, %: 2.66 g/dL
Total Protein, Serum Electrophoresis: 8.6 g/dL — ABNORMAL HIGH (ref 6.0–8.3)

## 2013-12-29 LAB — BETA 2 MICROGLOBULIN, SERUM: BETA 2 MICROGLOBULIN: 2.93 mg/L — AB (ref 1.01–1.73)

## 2014-01-01 ENCOUNTER — Encounter (INDEPENDENT_AMBULATORY_CARE_PROVIDER_SITE_OTHER): Payer: Medicare Other | Admitting: Ophthalmology

## 2014-01-01 DIAGNOSIS — H2510 Age-related nuclear cataract, unspecified eye: Secondary | ICD-10-CM

## 2014-01-01 DIAGNOSIS — H35039 Hypertensive retinopathy, unspecified eye: Secondary | ICD-10-CM

## 2014-01-01 DIAGNOSIS — I10 Essential (primary) hypertension: Secondary | ICD-10-CM

## 2014-01-01 DIAGNOSIS — H3530 Unspecified macular degeneration: Secondary | ICD-10-CM

## 2014-01-01 DIAGNOSIS — H353290 Exudative age-related macular degeneration, unspecified eye, stage unspecified: Secondary | ICD-10-CM

## 2014-01-01 DIAGNOSIS — H43819 Vitreous degeneration, unspecified eye: Secondary | ICD-10-CM

## 2014-01-02 ENCOUNTER — Encounter (INDEPENDENT_AMBULATORY_CARE_PROVIDER_SITE_OTHER): Payer: Medicare Other | Admitting: Ophthalmology

## 2014-01-02 LAB — FLOW CYTOMETRY

## 2014-01-03 ENCOUNTER — Telehealth: Payer: Self-pay | Admitting: *Deleted

## 2014-01-03 DIAGNOSIS — D472 Monoclonal gammopathy: Secondary | ICD-10-CM

## 2014-01-03 NOTE — Telephone Encounter (Signed)
Message copied by Tania Ade on Wed Jan 03, 2014  3:11 PM ------      Message from: [name] B      Created: Wed Jan 03, 2014  2:56 PM       Please call patient, b12 is normal, flow cytometry is negative for lymphoma or leukemia, m-spike and light chains slightly higher than 6 mo. Ago.            Recommend repeat spep,light chains, cbc, cmet and office 21months.       Copy labs to Dr. Felipa Eth ------

## 2014-01-03 NOTE — Telephone Encounter (Signed)
Pt returned call, lab results given. Dr. Benay Spice recommends repeat labs and office visit in 4 mos. Schedulers will call with appt. He voiced understanding. Order sent to schedulers.

## 2014-01-03 NOTE — Telephone Encounter (Signed)
Left VM for patient to call regarding lab results. Copy of labs routed to Dr. Felipa Eth in [facility]

## 2014-01-03 NOTE — Telephone Encounter (Signed)
Message copied by Brien Few on Wed Jan 03, 2014  4:36 PM ------      Message from: [name] B      Created: Wed Jan 03, 2014  2:56 PM       Please call patient, b12 is normal, flow cytometry is negative for lymphoma or leukemia, m-spike and light chains slightly higher than 6 mo. Ago.            Recommend repeat spep,light chains, cbc, cmet and office 89months.       Copy labs to Dr. Felipa Eth ------

## 2014-01-04 ENCOUNTER — Telehealth: Payer: Self-pay | Admitting: Oncology

## 2014-01-04 ENCOUNTER — Encounter (INDEPENDENT_AMBULATORY_CARE_PROVIDER_SITE_OTHER): Payer: Medicare Other | Admitting: Ophthalmology

## 2014-01-04 NOTE — Telephone Encounter (Signed)
lvm for pt regarding to May appt....mailed pt appt sched, avs and letter °

## 2014-01-06 ENCOUNTER — Encounter: Payer: Self-pay | Admitting: Cardiology

## 2014-01-12 ENCOUNTER — Ambulatory Visit (INDEPENDENT_AMBULATORY_CARE_PROVIDER_SITE_OTHER): Payer: Medicare Other | Admitting: Cardiology

## 2014-01-12 ENCOUNTER — Encounter: Payer: Self-pay | Admitting: Cardiology

## 2014-01-12 VITALS — BP 138/65 | HR 60 | Ht 67.5 in | Wt 183.4 lb

## 2014-01-12 DIAGNOSIS — R0609 Other forms of dyspnea: Secondary | ICD-10-CM

## 2014-01-12 DIAGNOSIS — D472 Monoclonal gammopathy: Secondary | ICD-10-CM

## 2014-01-12 DIAGNOSIS — Z87891 Personal history of nicotine dependence: Secondary | ICD-10-CM | POA: Insufficient documentation

## 2014-01-12 DIAGNOSIS — R0600 Dyspnea, unspecified: Secondary | ICD-10-CM

## 2014-01-12 DIAGNOSIS — I208 Other forms of angina pectoris: Secondary | ICD-10-CM

## 2014-01-12 DIAGNOSIS — R0989 Other specified symptoms and signs involving the circulatory and respiratory systems: Secondary | ICD-10-CM

## 2014-01-12 DIAGNOSIS — E785 Hyperlipidemia, unspecified: Secondary | ICD-10-CM

## 2014-01-12 NOTE — Patient Instructions (Addendum)
Your physician recommends that you continue on your current medications as directed. Please refer to the Current Medication list given to you today.  Your physician has requested that you have an echocardiogram. Echocardiography is a painless test that uses sound waves to create images of your heart. It provides your doctor with information about the size and shape of your heart and how well your heart's chambers and valves are working. This procedure takes approximately one hour. There are no restrictions for this procedure.  Your physician has requested that you have en exercise stress myoview. For further information please visit www.cardiosmart.org. Please follow instruction sheet, as given.  Your physician recommends that you follow-up as needed. 

## 2014-01-12 NOTE — Progress Notes (Signed)
[name] [address], [place]  [address] Phone: [phone] Fax:  [phone]  Date:  01/12/2014   ID:  [name], DOB 26-Oct-1934, MRN [id number]  PCP:  Mathews Argyle, MD   History of Present Illness: [name] is a 80 y.o. male here for the evaluation of new onset dyspnea and fatigue. His wife has noted that he seems more short of breath on minimal exertion. Decreased stamina. Walking cause this and he had to rest when he returned from an hour. No chest pain, no diaphoresis, no significant edema. He does have a history of hypertension, obstructive sleep apnea and monoclonal gammopathy followed by Dr. Benay Spice.  Was a Teacher, music for [employer]. 7 years ago HTN urgency. Had a few beats of VT overnight. Had ETT. Did fine. ? ECHO.    Wt Readings from Last 3 Encounters:  01/12/14 183 lb 6.4 oz (83.19 kg)  12/25/13 182 lb 3.2 oz (82.645 kg)  06/27/13 181 lb 11.2 oz (82.419 kg)     Past Medical History  Diagnosis Date  . Hypertension   . Monoclonal paraproteinemia   . Hyperlipemia   . GERD (gastroesophageal reflux disease)   . Irritable bowel syndrome (IBS)   . Thyroid disease     Hypothyroidism  . Dupuytren's contracture   . Vitamin B12 deficiency   . Macular degeneration   . Barrett's esophagus   . Elevated MCV     , B12 and normal b12  . Hypothyroidism     Past Surgical History  Procedure Laterality Date  . Skin cancer excision      Left ear  . Tonsillectomy      age 34 1/2  . Colonoscopy w/ polypectomy      at 80yo---benign  . Esophagogastroduodenoscopy      Current Outpatient Prescriptions  Medication Sig Dispense Refill  . aspirin 81 MG tablet Take 81 mg by mouth daily.        Marland Kitchen atenolol (TENORMIN) 50 MG tablet Take 75 mg by mouth daily.      . furosemide (LASIX) 20 MG tablet Take 20 mg by mouth daily.      . hyoscyamine (LEVSIN, ANASPAZ) 0.125 MG tablet Take 0.125 mg by mouth as directed. Tablet 1 tablet before meals  q.6 h. p.r.n.      Marland Kitchen ibuprofen (ADVIL,MOTRIN) 800 MG tablet Take 800 mg by mouth as needed.        Marland Kitchen ketorolac (ACULAR) 0.5 % ophthalmic solution       . levothyroxine (SYNTHROID) 137 MCG tablet Take 137 mcg by mouth daily before breakfast.      . Multiple Vitamins-Minerals (PRESERVISION AREDS PO) Take 1 capsule by mouth daily.        Marland Kitchen ofloxacin (OCUFLOX) 0.3 % ophthalmic solution       . pantoprazole (PROTONIX) 40 MG tablet Take 40 mg by mouth daily.        . prednisoLONE acetate (PRED FORTE) 1 % ophthalmic suspension       . simvastatin (ZOCOR) 20 MG tablet Take 20 mg by mouth at bedtime.         No current facility-administered medications for this visit.    Allergies:    Allergies  Allergen Reactions  . Hydrochlorothiazide Other (See Comments)    hyponatremia   Family history: Mother is 78, Father lung Ca.  Social History:  The patient  reports that he quit smoking about 25 years ago. He does not have  any smokeless tobacco history on file. He reports that he drinks alcohol. He reports that he does not use illicit drugs.   Family History  Problem Relation Age of Onset  . Lung cancer Father   . Lung cancer Brother     ROS:  Please see the history of present illness.   Denies any fevers, chills, orthopnea, PND, rashes, syncope, bleeding, stroke   All other systems reviewed and negative.   PHYSICAL EXAM: VS:  BP 138/65  Pulse 60  Ht 5' 7.5" (1.715 m)  Wt 183 lb 6.4 oz (83.19 kg)  BMI 28.28 kg/m2 Well nourished, well developed, in no acute distress HEENT: normal, [place]/AT, EOMI Neck: no JVD, normal carotid upstroke, no bruit Cardiac:  normal S1, S2; RRR; no murmur Lungs:  clear to auscultation bilaterally, no wheezing, rhonchi or rales Abd: soft, nontender, no hepatomegaly, no bruits Ext: no edema, 2+ distal pulses Skin: warm and dry GU: deferred Neuro: no focal abnormalities noted, AAO x 3  EKG:  Sinus rhythm rate 60 with no other abnormalities.    Labs:  12/27/13-hemoglobin 12.6, vitamin B12 431 Lab work, prior medical records reviewed.  ASSESSMENT AND PLAN:  1. Dyspnea on exertion-possible anginal equivalent. Last hemoglobin 12.6 on 12/27/13. Improved since November 2014 but still SOB. Sensation could be related to mild anemia, MGUS. Vitamin B12 appears normal. I do not exclude the possibility of ischemia however. He did, 7 years ago, have a brief nonsustained run of ventricular tachycardia in the hospital when being observed 4 hypertensive urgency. I will also check an echocardiogram to ensure that he does not have any structural malformation. 2. Monoclonal gammopathy-Dr. Benay Spice, prior note reviewed. 3. Hypertension-currently well controlled 4. Former smoker-quit approximately 25 years ago. 5. Hyperlipidemia-continue with statin. Aspirin for primary prevention.  Signed, Candee Furbish, MD [facility]  01/12/2014 11:31 AM

## 2014-01-29 ENCOUNTER — Ambulatory Visit (HOSPITAL_COMMUNITY): Payer: Medicare Other | Attending: Cardiology | Admitting: Radiology

## 2014-01-29 VITALS — BP 148/66 | HR 59 | Ht 68.0 in | Wt 182.0 lb

## 2014-01-29 DIAGNOSIS — R0609 Other forms of dyspnea: Secondary | ICD-10-CM | POA: Insufficient documentation

## 2014-01-29 DIAGNOSIS — E785 Hyperlipidemia, unspecified: Secondary | ICD-10-CM | POA: Insufficient documentation

## 2014-01-29 DIAGNOSIS — R5381 Other malaise: Secondary | ICD-10-CM | POA: Insufficient documentation

## 2014-01-29 DIAGNOSIS — I10 Essential (primary) hypertension: Secondary | ICD-10-CM | POA: Insufficient documentation

## 2014-01-29 DIAGNOSIS — I208 Other forms of angina pectoris: Secondary | ICD-10-CM

## 2014-01-29 DIAGNOSIS — R0989 Other specified symptoms and signs involving the circulatory and respiratory systems: Secondary | ICD-10-CM | POA: Insufficient documentation

## 2014-01-29 DIAGNOSIS — Z87891 Personal history of nicotine dependence: Secondary | ICD-10-CM | POA: Insufficient documentation

## 2014-01-29 DIAGNOSIS — R5383 Other fatigue: Secondary | ICD-10-CM

## 2014-01-29 MED ORDER — TECHNETIUM TC 99M SESTAMIBI GENERIC - CARDIOLITE
10.0000 | Freq: Once | INTRAVENOUS | Status: AC | PRN
  Administered 2014-01-29: 10 via INTRAVENOUS

## 2014-01-29 MED ORDER — TECHNETIUM TC 99M SESTAMIBI GENERIC - CARDIOLITE
30.0000 | Freq: Once | INTRAVENOUS | Status: AC | PRN
  Administered 2014-01-29: 30 via INTRAVENOUS

## 2014-01-29 NOTE — Progress Notes (Signed)
[facility] [place] [address] [place], [place] [address] [phone]    Cardiology Nuclear Med Study  [name] is a 80 y.o. male     MRN : [id number]     DOB: 08-26-1934  Procedure Date: 01/29/2014  Nuclear Med Background Indication for Stress Test:  Evaluation for Ischemia History:  No known CAD, Echo, hx. GXT (normal) Cardiac Risk Factors: History of Smoking, Hypertension and Lipids  Symptoms:  DOE and Fatigue   Nuclear Pre-Procedure Caffeine/Decaff Intake:  None NPO After: 8:00pm   Lungs:  clear O2 Sat: 98% on room air. IV 0.9% NS with Angio Cath:  22g  IV Site: R Hand  IV Started by:  Crissie Figures, RN  Chest Size (in):  44 Cup Size: n/a  Height: 5\' 8"  (1.727 m)  Weight:  182 lb (82.555 kg)  BMI:  Body mass index is 27.68 kg/(m^2). Tech Comments:  Atenolol x 24 hrs    Nuclear Med Study 1 or 2 day study: 1 day  Stress Test Type:  Stress  Reading MD: N/A  Order Authorizing Provider:  Candee Furbish, MD  Resting Radionuclide: Technetium 69m Sestamibi  Resting Radionuclide Dose: 11.0 mCi   Stress Radionuclide:  Technetium 92m Sestamibi  Stress Radionuclide Dose: 33.0 mCi           Stress Protocol Rest HR: 59 Stress HR: 133  Rest BP: 148/66 Stress BP: 220/91  Exercise Time (min): 4:30 METS: 6.4           Dose of Adenosine (mg):  n/a Dose of Lexiscan: n/a mg  Dose of Atropine (mg): n/a Dose of Dobutamine: n/a mcg/kg/min (at max HR)  Stress Test Technologist: Perrin Maltese, EMT-P  Nuclear Technologist:  Annye Rusk, CNMT     Rest Procedure:  Myocardial perfusion imaging was performed at rest 45 minutes following the intravenous administration of Technetium 45m Sestamibi. Rest ECG: NSR - Normal EKG  Stress Procedure:  The patient exercised on the treadmill utilizing the Bruce Protocol for 4:30 minutes. The patient stopped due to SOB and denied any chest pain.  Technetium 70m Sestamibi was injected at peak exercise and myocardial  perfusion imaging was performed after a brief delay. Stress ECG: No significant change from baseline ECG, frequent PVCs  QPS Raw Data Images:  Normal; no motion artifact; normal heart/lung ratio. Stress Images:  Normal homogeneous uptake in all areas of the myocardium. Rest Images:  Normal homogeneous uptake in all areas of the myocardium. Subtraction (SDS):  No evidence of ischemia. Transient Ischemic Dilatation (Normal <1.22):  1.07 Lung/Heart Ratio (Normal <0.45):  0.28  Quantitative Gated Spect Images QGS EDV:  87 ml QGS ESV:  28 ml  Impression Exercise Capacity:  Good exercise capacity. BP Response:  Hypertensive blood pressure response. Clinical Symptoms:  Mild chest pain/dyspnea. ECG Impression:  No significant ST segment change suggestive of ischemia. Comparison with Prior Nuclear Study: Prior normal GXT.  Overall Impression:  Normal stress nuclear study.  LV Ejection Fraction: 68%.  LV Wall Motion:  NL LV Function; NL Wall Motion   Ena Dawley, Lemmie Evens 01/29/2014

## 2014-02-01 ENCOUNTER — Other Ambulatory Visit: Payer: Self-pay

## 2014-02-01 ENCOUNTER — Ambulatory Visit (HOSPITAL_COMMUNITY): Payer: Medicare Other | Attending: Cardiology | Admitting: Radiology

## 2014-02-01 DIAGNOSIS — I10 Essential (primary) hypertension: Secondary | ICD-10-CM | POA: Insufficient documentation

## 2014-02-01 DIAGNOSIS — I4729 Other ventricular tachycardia: Secondary | ICD-10-CM | POA: Insufficient documentation

## 2014-02-01 DIAGNOSIS — I079 Rheumatic tricuspid valve disease, unspecified: Secondary | ICD-10-CM | POA: Insufficient documentation

## 2014-02-01 DIAGNOSIS — E785 Hyperlipidemia, unspecified: Secondary | ICD-10-CM | POA: Insufficient documentation

## 2014-02-01 DIAGNOSIS — Z87891 Personal history of nicotine dependence: Secondary | ICD-10-CM | POA: Insufficient documentation

## 2014-02-01 DIAGNOSIS — I472 Ventricular tachycardia, unspecified: Secondary | ICD-10-CM | POA: Insufficient documentation

## 2014-02-01 DIAGNOSIS — R0602 Shortness of breath: Secondary | ICD-10-CM | POA: Insufficient documentation

## 2014-02-01 DIAGNOSIS — R0600 Dyspnea, unspecified: Secondary | ICD-10-CM

## 2014-02-01 DIAGNOSIS — I059 Rheumatic mitral valve disease, unspecified: Secondary | ICD-10-CM | POA: Insufficient documentation

## 2014-02-01 NOTE — Progress Notes (Signed)
Echocardiogram performed.  

## 2014-02-05 ENCOUNTER — Encounter (INDEPENDENT_AMBULATORY_CARE_PROVIDER_SITE_OTHER): Payer: Medicare Other | Admitting: Ophthalmology

## 2014-02-05 DIAGNOSIS — I10 Essential (primary) hypertension: Secondary | ICD-10-CM

## 2014-02-05 DIAGNOSIS — H353290 Exudative age-related macular degeneration, unspecified eye, stage unspecified: Secondary | ICD-10-CM

## 2014-02-05 DIAGNOSIS — H3530 Unspecified macular degeneration: Secondary | ICD-10-CM

## 2014-02-05 DIAGNOSIS — H35039 Hypertensive retinopathy, unspecified eye: Secondary | ICD-10-CM

## 2014-02-05 DIAGNOSIS — H43819 Vitreous degeneration, unspecified eye: Secondary | ICD-10-CM

## 2014-03-06 ENCOUNTER — Other Ambulatory Visit: Payer: Self-pay | Admitting: Dermatology

## 2014-03-12 ENCOUNTER — Encounter (INDEPENDENT_AMBULATORY_CARE_PROVIDER_SITE_OTHER): Payer: Medicare Other | Admitting: Ophthalmology

## 2014-03-12 DIAGNOSIS — H35039 Hypertensive retinopathy, unspecified eye: Secondary | ICD-10-CM

## 2014-03-12 DIAGNOSIS — I10 Essential (primary) hypertension: Secondary | ICD-10-CM

## 2014-03-12 DIAGNOSIS — H353290 Exudative age-related macular degeneration, unspecified eye, stage unspecified: Secondary | ICD-10-CM

## 2014-03-12 DIAGNOSIS — H43819 Vitreous degeneration, unspecified eye: Secondary | ICD-10-CM

## 2014-03-12 DIAGNOSIS — H3530 Unspecified macular degeneration: Secondary | ICD-10-CM

## 2014-03-29 ENCOUNTER — Encounter (HOSPITAL_BASED_OUTPATIENT_CLINIC_OR_DEPARTMENT_OTHER): Payer: Self-pay | Admitting: *Deleted

## 2014-03-29 NOTE — Progress Notes (Signed)
Almost finished call and pt states surgery 05/02/14 not 4/20 Called dr Carita Pian office-will talk to him later

## 2014-03-30 ENCOUNTER — Other Ambulatory Visit: Payer: Self-pay | Admitting: Orthopedic Surgery

## 2014-04-16 ENCOUNTER — Encounter (INDEPENDENT_AMBULATORY_CARE_PROVIDER_SITE_OTHER): Payer: Medicare Other | Admitting: Ophthalmology

## 2014-04-20 ENCOUNTER — Encounter (INDEPENDENT_AMBULATORY_CARE_PROVIDER_SITE_OTHER): Payer: Medicare Other | Admitting: Ophthalmology

## 2014-04-20 DIAGNOSIS — I10 Essential (primary) hypertension: Secondary | ICD-10-CM

## 2014-04-20 DIAGNOSIS — H43819 Vitreous degeneration, unspecified eye: Secondary | ICD-10-CM

## 2014-04-20 DIAGNOSIS — H3530 Unspecified macular degeneration: Secondary | ICD-10-CM

## 2014-04-20 DIAGNOSIS — H35039 Hypertensive retinopathy, unspecified eye: Secondary | ICD-10-CM

## 2014-04-20 DIAGNOSIS — H353290 Exudative age-related macular degeneration, unspecified eye, stage unspecified: Secondary | ICD-10-CM

## 2014-04-27 ENCOUNTER — Encounter (HOSPITAL_BASED_OUTPATIENT_CLINIC_OR_DEPARTMENT_OTHER): Payer: Self-pay | Admitting: *Deleted

## 2014-04-27 NOTE — Progress Notes (Signed)
Pt will come in for bmet

## 2014-05-01 ENCOUNTER — Encounter (HOSPITAL_BASED_OUTPATIENT_CLINIC_OR_DEPARTMENT_OTHER)
Admission: RE | Admit: 2014-05-01 | Discharge: 2014-05-01 | Disposition: A | Discharge status: Home / Self Care | Payer: Medicare Other | Source: Ambulatory Visit | Attending: Orthopedic Surgery | Admitting: Orthopedic Surgery

## 2014-05-01 LAB — BASIC METABOLIC PANEL
BUN: 8 mg/dL (ref 6–23)
CHLORIDE: 101 meq/L (ref 96–112)
CO2: 25 meq/L (ref 19–32)
Calcium: 8.5 mg/dL (ref 8.4–10.5)
Creatinine, Ser: 0.72 mg/dL (ref 0.50–1.35)
GFR calc Af Amer: >90 mL/min (ref >90)
GFR, EST NON AFRICAN AMERICAN: 87 mL/min — AB (ref >90)
GLUCOSE: 97 mg/dL (ref 70–99)
Potassium: 3.8 mEq/L (ref 3.7–5.3)
SODIUM: 138 meq/L (ref 137–147)

## 2014-05-02 ENCOUNTER — Ambulatory Visit (HOSPITAL_BASED_OUTPATIENT_CLINIC_OR_DEPARTMENT_OTHER)
Admission: RE | Admit: 2014-05-02 | Discharge: 2014-05-02 | Disposition: A | Discharge status: Home / Self Care | Payer: Medicare Other | Source: Ambulatory Visit | Attending: Orthopedic Surgery | Admitting: Orthopedic Surgery

## 2014-05-02 ENCOUNTER — Encounter (HOSPITAL_BASED_OUTPATIENT_CLINIC_OR_DEPARTMENT_OTHER)
Admission: RE | Disposition: A | Discharge status: Home / Self Care | Payer: Self-pay | Source: Ambulatory Visit | Attending: Orthopedic Surgery

## 2014-05-02 ENCOUNTER — Encounter (HOSPITAL_BASED_OUTPATIENT_CLINIC_OR_DEPARTMENT_OTHER): Payer: Self-pay | Admitting: Anesthesiology

## 2014-05-02 ENCOUNTER — Encounter (HOSPITAL_BASED_OUTPATIENT_CLINIC_OR_DEPARTMENT_OTHER): Payer: Medicare Other | Admitting: Anesthesiology

## 2014-05-02 ENCOUNTER — Ambulatory Visit (HOSPITAL_BASED_OUTPATIENT_CLINIC_OR_DEPARTMENT_OTHER): Payer: Medicare Other | Admitting: Anesthesiology

## 2014-05-02 DIAGNOSIS — Z7982 Long term (current) use of aspirin: Secondary | ICD-10-CM | POA: Insufficient documentation

## 2014-05-02 DIAGNOSIS — D472 Monoclonal gammopathy: Secondary | ICD-10-CM

## 2014-05-02 DIAGNOSIS — G4730 Sleep apnea, unspecified: Secondary | ICD-10-CM | POA: Insufficient documentation

## 2014-05-02 DIAGNOSIS — Z87891 Personal history of nicotine dependence: Secondary | ICD-10-CM | POA: Insufficient documentation

## 2014-05-02 DIAGNOSIS — I10 Essential (primary) hypertension: Secondary | ICD-10-CM | POA: Insufficient documentation

## 2014-05-02 DIAGNOSIS — E039 Hypothyroidism, unspecified: Secondary | ICD-10-CM | POA: Insufficient documentation

## 2014-05-02 DIAGNOSIS — K589 Irritable bowel syndrome without diarrhea: Secondary | ICD-10-CM | POA: Insufficient documentation

## 2014-05-02 DIAGNOSIS — H3530 Unspecified macular degeneration: Secondary | ICD-10-CM | POA: Insufficient documentation

## 2014-05-02 DIAGNOSIS — M19049 Primary osteoarthritis, unspecified hand: Secondary | ICD-10-CM | POA: Insufficient documentation

## 2014-05-02 DIAGNOSIS — M720 Palmar fascial fibromatosis [Dupuytren]: Secondary | ICD-10-CM | POA: Insufficient documentation

## 2014-05-02 DIAGNOSIS — Z79899 Other long term (current) drug therapy: Secondary | ICD-10-CM | POA: Insufficient documentation

## 2014-05-02 DIAGNOSIS — K219 Gastro-esophageal reflux disease without esophagitis: Secondary | ICD-10-CM | POA: Insufficient documentation

## 2014-05-02 DIAGNOSIS — E785 Hyperlipidemia, unspecified: Secondary | ICD-10-CM | POA: Insufficient documentation

## 2014-05-02 HISTORY — PX: FASCIOTOMY: SHX132

## 2014-05-02 HISTORY — DX: Presence of spectacles and contact lenses: Z97.3

## 2014-05-02 LAB — POCT HEMOGLOBIN-HEMACUE: Hemoglobin: 12.5 g/dL — ABNORMAL LOW (ref 13.0–17.0)

## 2014-05-02 SURGERY — FASCIOTOMY, UPPER EXTREMITY
Anesthesia: General | Site: Finger | Laterality: Left

## 2014-05-02 MED ORDER — CEFAZOLIN SODIUM-DEXTROSE 2-3 GM-% IV SOLR: 2.0000 g | INTRAVENOUS | Status: DC

## 2014-05-02 MED ORDER — MIDAZOLAM HCL 2 MG/2ML IJ SOLN
1.0000 mg | INTRAMUSCULAR | Status: DC | PRN
  Administered 2014-05-02: 1 mg via INTRAVENOUS

## 2014-05-02 MED ORDER — CEFAZOLIN SODIUM-DEXTROSE 2-3 GM-% IV SOLR
2.0000 g | INTRAVENOUS | Status: AC
  Administered 2014-05-02: 2 g via INTRAVENOUS

## 2014-05-02 MED ORDER — PROMETHAZINE HCL 25 MG/ML IJ SOLN: 6.2500 mg | INTRAMUSCULAR | Status: DC | PRN

## 2014-05-02 MED ORDER — BUPIVACAINE-EPINEPHRINE (PF) 0.5% -1:200000 IJ SOLN
INTRAMUSCULAR | Status: DC | PRN
  Administered 2014-05-02: 30 mL

## 2014-05-02 MED ORDER — LIDOCAINE HCL (CARDIAC) 20 MG/ML IV SOLN
INTRAVENOUS | Status: DC | PRN
  Administered 2014-05-02: 20 mg via INTRAVENOUS

## 2014-05-02 MED ORDER — FENTANYL CITRATE 0.05 MG/ML IJ SOLN
INTRAMUSCULAR | Status: AC
  Filled 2014-05-02: qty 2

## 2014-05-02 MED ORDER — ATENOLOL 50 MG PO TABS
75.0000 mg | ORAL_TABLET | Freq: Once | ORAL | Status: AC
  Administered 2014-05-02: 75 mg via ORAL
  Filled 2014-05-02: qty 1

## 2014-05-02 MED ORDER — FENTANYL CITRATE 0.05 MG/ML IJ SOLN
INTRAMUSCULAR | Status: DC | PRN
  Administered 2014-05-02: 50 ug via INTRAVENOUS
  Administered 2014-05-02 (×2): 25 ug via INTRAVENOUS

## 2014-05-02 MED ORDER — OXYCODONE HCL 5 MG PO TABS: 5.0000 mg | ORAL_TABLET | Freq: Once | ORAL | Status: DC | PRN

## 2014-05-02 MED ORDER — FENTANYL CITRATE 0.05 MG/ML IJ SOLN
INTRAMUSCULAR | Status: AC
  Filled 2014-05-02: qty 6

## 2014-05-02 MED ORDER — FENTANYL CITRATE 0.05 MG/ML IJ SOLN
25.0000 ug | INTRAMUSCULAR | Status: DC | PRN
Start: 2014-05-02 — End: 2014-05-02

## 2014-05-02 MED ORDER — MEPERIDINE HCL 25 MG/ML IJ SOLN: 6.2500 mg | INTRAMUSCULAR | Status: DC | PRN

## 2014-05-02 MED ORDER — MIDAZOLAM HCL 2 MG/2ML IJ SOLN
INTRAMUSCULAR | Status: AC
  Filled 2014-05-02: qty 2

## 2014-05-02 MED ORDER — GLYCOPYRROLATE 0.2 MG/ML IJ SOLN
INTRAMUSCULAR | Status: DC | PRN
  Administered 2014-05-02: 0.2 mg via INTRAVENOUS

## 2014-05-02 MED ORDER — LACTATED RINGERS IV SOLN
INTRAVENOUS | Status: DC
  Administered 2014-05-02 (×2): via INTRAVENOUS

## 2014-05-02 MED ORDER — OXYCODONE HCL 5 MG/5ML PO SOLN: 5.0000 mg | Freq: Once | ORAL | Status: DC | PRN

## 2014-05-02 MED ORDER — LIDOCAINE-EPINEPHRINE (PF) 1.5 %-1:200000 IJ SOLN
INTRAMUSCULAR | Status: DC | PRN
  Administered 2014-05-02: 20 mL via PERINEURAL

## 2014-05-02 MED ORDER — DEXAMETHASONE SODIUM PHOSPHATE 10 MG/ML IJ SOLN
INTRAMUSCULAR | Status: DC | PRN
  Administered 2014-05-02: 10 mg via INTRAVENOUS

## 2014-05-02 MED ORDER — PROPOFOL 10 MG/ML IV BOLUS
INTRAVENOUS | Status: DC | PRN
  Administered 2014-05-02: 180 mg via INTRAVENOUS
  Administered 2014-05-02: 20 mg via INTRAVENOUS

## 2014-05-02 MED ORDER — FENTANYL CITRATE 0.05 MG/ML IJ SOLN
50.0000 ug | INTRAMUSCULAR | Status: DC | PRN
  Administered 2014-05-02: 50 ug via INTRAVENOUS

## 2014-05-02 MED ORDER — TRAMADOL HCL 50 MG PO TABS: 50.0000 mg | ORAL_TABLET | Freq: Four times a day (QID) | ORAL | Status: DC | PRN

## 2014-05-02 MED ORDER — CHLORHEXIDINE GLUCONATE 4 % EX LIQD: 60.0000 mL | Freq: Once | CUTANEOUS | Status: DC

## 2014-05-02 MED ORDER — MIDAZOLAM HCL 5 MG/5ML IJ SOLN
INTRAMUSCULAR | Status: DC | PRN
  Administered 2014-05-02: 2 mg via INTRAVENOUS

## 2014-05-02 MED ORDER — HYDROMORPHONE HCL PF 1 MG/ML IJ SOLN: 0.2500 mg | INTRAMUSCULAR | Status: DC | PRN

## 2014-05-02 SURGICAL SUPPLY — 47 items
BLADE MINI RND TIP GREEN BEAV (BLADE) ×3 IMPLANT
BLADE SURG 15 STRL LF DISP TIS (BLADE) ×3 IMPLANT
BLADE SURG 15 STRL SS (BLADE) ×6
BNDG COHESIVE 3X5 TAN STRL LF (GAUZE/BANDAGES/DRESSINGS) ×3 IMPLANT
BNDG ESMARK 4X9 LF (GAUZE/BANDAGES/DRESSINGS) ×3 IMPLANT
BNDG GAUZE ELAST 4 BULKY (GAUZE/BANDAGES/DRESSINGS) ×3 IMPLANT
CHLORAPREP W/TINT 26ML (MISCELLANEOUS) ×3 IMPLANT
CORDS BIPOLAR (ELECTRODE) ×3 IMPLANT
COVER MAYO STAND STRL (DRAPES) ×3 IMPLANT
COVER TABLE BACK 60X90 (DRAPES) ×3 IMPLANT
CUFF TOURNIQUET SINGLE 18IN (TOURNIQUET CUFF) ×3 IMPLANT
DECANTER SPIKE VIAL GLASS SM (MISCELLANEOUS) IMPLANT
DRAPE EXTREMITY T 121X128X90 (DRAPE) ×3 IMPLANT
DRAPE SURG 17X23 STRL (DRAPES) ×3 IMPLANT
DRSG KUZMA FLUFF (GAUZE/BANDAGES/DRESSINGS) IMPLANT
DRSG PAD ABDOMINAL 8X10 ST (GAUZE/BANDAGES/DRESSINGS) IMPLANT
GAUZE SPONGE 4X4 12PLY STRL (GAUZE/BANDAGES/DRESSINGS) ×3 IMPLANT
GAUZE XEROFORM 1X8 LF (GAUZE/BANDAGES/DRESSINGS) ×3 IMPLANT
GLOVE BIOGEL M STRL SZ7.5 (GLOVE) ×3 IMPLANT
GLOVE BIOGEL PI IND STRL 8 (GLOVE) ×1 IMPLANT
GLOVE BIOGEL PI IND STRL 8.5 (GLOVE) ×1 IMPLANT
GLOVE BIOGEL PI INDICATOR 8 (GLOVE) ×2
GLOVE BIOGEL PI INDICATOR 8.5 (GLOVE) ×2
GLOVE SURG ORTHO 8.0 STRL STRW (GLOVE) ×3 IMPLANT
GOWN STRL REUS W/ TWL LRG LVL3 (GOWN DISPOSABLE) ×1 IMPLANT
GOWN STRL REUS W/TWL LRG LVL3 (GOWN DISPOSABLE) ×2
GOWN STRL REUS W/TWL XL LVL3 (GOWN DISPOSABLE) ×3 IMPLANT
NEEDLE 27GAX1X1/2 (NEEDLE) IMPLANT
NS IRRIG 1000ML POUR BTL (IV SOLUTION) ×3 IMPLANT
PACK BASIN DAY SURGERY FS (CUSTOM PROCEDURE TRAY) ×3 IMPLANT
PAD CAST 3X4 CTTN HI CHSV (CAST SUPPLIES) ×1 IMPLANT
PADDING CAST ABS 3INX4YD NS (CAST SUPPLIES)
PADDING CAST ABS 4INX4YD NS (CAST SUPPLIES) ×2
PADDING CAST ABS COTTON 3X4 (CAST SUPPLIES) IMPLANT
PADDING CAST ABS COTTON 4X4 ST (CAST SUPPLIES) ×1 IMPLANT
PADDING CAST COTTON 3X4 STRL (CAST SUPPLIES) ×2
SLEEVE SCD COMPRESS KNEE MED (MISCELLANEOUS) ×3 IMPLANT
SPLINT PLASTER CAST XFAST 3X15 (CAST SUPPLIES) ×5 IMPLANT
SPLINT PLASTER XTRA FASTSET 3X (CAST SUPPLIES) ×10
STOCKINETTE 4X48 STRL (DRAPES) ×3 IMPLANT
SUT SILK 2 0 FS (SUTURE) IMPLANT
SUT VICRYL RAPID 5 0 P 3 (SUTURE) IMPLANT
SUT VICRYL RAPIDE 4/0 PS 2 (SUTURE) ×3 IMPLANT
SYR BULB 3OZ (MISCELLANEOUS) ×3 IMPLANT
SYR CONTROL 10ML LL (SYRINGE) IMPLANT
TOWEL OR 17X24 6PK STRL BLUE (TOWEL DISPOSABLE) ×6 IMPLANT
UNDERPAD 30X30 INCONTINENT (UNDERPADS AND DIAPERS) ×3 IMPLANT

## 2014-05-02 NOTE — Anesthesia Preprocedure Evaluation (Addendum)
Anesthesia Evaluation  Patient identified by MRN, date of birth, ID band Patient awake    Reviewed: Allergy & Precautions, H&P , NPO status , Patient's Chart, lab work & pertinent test results, reviewed documented beta blocker date and time   History of Anesthesia Complications Negative for: history of anesthetic complications  Airway Mallampati: II TM Distance: >3 FB Neck ROM: Full    Dental no notable dental hx. (+) Teeth Intact, Caps, Dental Advisory Given   Pulmonary neg pulmonary ROS, sleep apnea and Continuous Positive Airway Pressure Ventilation , former smoker (quit 1989),  breath sounds clear to auscultation  Pulmonary exam normal       Cardiovascular hypertension, On Medications and On Home Beta Blockers - anginaRhythm:Regular Rate:Normal  2/15 stress test: no ischemia, normal perfusion, EF 68%   Neuro/Psych negative neurological ROS  negative psych ROS   GI/Hepatic Neg liver ROS, GERD-  Medicated and Controlled,  Endo/Other  Hypothyroidism   Renal/GU negative Renal ROS  negative genitourinary   Musculoskeletal   Abdominal   Peds  Hematology negative hematology ROS (+)   Anesthesia Other Findings   Reproductive/Obstetrics negative OB ROS                       Anesthesia Physical Anesthesia Plan  ASA: III  Anesthesia Plan: General   Post-op Pain Management:    Induction: Intravenous  Airway Management Planned: LMA  Additional Equipment:   Intra-op Plan:   Post-operative Plan:   Informed Consent: I have reviewed the patients History and Physical, chart, labs and discussed the procedure including the risks, benefits and alternatives for the proposed anesthesia with the patient or authorized representative who has indicated his/her understanding and acceptance.   Dental advisory given  Plan Discussed with: CRNA and Surgeon  Anesthesia Plan Comments: (Plan routine monitors,  GA- LMA OK, axillary block for post op analgesia)      Anesthesia Quick Evaluation

## 2014-05-02 NOTE — Brief Op Note (Signed)
05/02/2014  11:07 AM  PATIENT:  [name]  80 y.o. male  PRE-OPERATIVE DIAGNOSIS:  dupuytren's contracture left thumb 1st web small   POST-OPERATIVE DIAGNOSIS:  Dupuytrens contracture left thumb and small  PROCEDURE:  Procedure(s): FASCIOTOMY LEFT THUMB 1ST WEB SMALL FINGER  (Left)  SURGEON:  Surgeon(s) and Role:    * Wynonia Sours, MD - Primary  PHYSICIAN ASSISTANT:   ASSISTANTS: none   ANESTHESIA:   regional and general  EBL:  Total I/O In: 1000 [I.V.:1000] Out: -   BLOOD ADMINISTERED:none  DRAINS: none   LOCAL MEDICATIONS USED:  NONE  SPECIMEN:  Excision  DISPOSITION OF SPECIMEN:  PATHOLOGY  COUNTS:  YES  TOURNIQUET:   Total Tourniquet Time Documented: Upper Arm (Left) - 39 minutes Total: Upper Arm (Left) - 39 minutes   DICTATION: .Other Dictation: Dictation Number [id number]  PLAN OF CARE: Discharge to home after PACU  PATIENT DISPOSITION:  PACU - hemodynamically stable.

## 2014-05-02 NOTE — H&P (Signed)
[name] is a 80 year old former patient who has not been seen in 5 years. He comes in complaining of Dupuytren's contracture bilaterally, left greater than right. His left hand is a problem. He has had this injected with Xiaflex both thumb and small fingers by Dr. Caralyn Guile in 2011. He states it has recurred. He has no new history of injury. He states it is getting worse. His major complaint is the small finger which is flexed at both MP, PIP and DIP joints nearly into the palm of his hand. He has similar deformity on his left side with contracture to the PIP and DIP but full mobility to the MP of his small fingers. He also has first web space cords. There are no cords to the thumbs bilaterally. He is left hand dominant. He states this significant impairs his function. He states he has had contracture since the age of 66. He is of [place] descent. He does have these on his feet. There is a family history of diabetes. He is negative for it.   PAST MEDICAL HISTORY: He has no known drug allergies. He is on Atenolol, Lasix, Levothyroxine, Protonix, simvastatin, and aspirin. He has not had any surgery.  FAMILY H ISTORY: Negative.  SOCIAL HISTORY: He does not smoke. He drinks daily. He is married and retired.  REVIEW OF SYSTEMS: Positive for glasses, ringing in his ears, high BP, otherwise negative for 14 points. [name] is an 80 y.o. male.   Chief Complaint: Dupuytren's contracture left hand HPI: see above  Past Medical History  Diagnosis Date  . Hypertension   . Monoclonal paraproteinemia   . Hyperlipemia   . GERD (gastroesophageal reflux disease)   . Irritable bowel syndrome (IBS)   . Thyroid disease     Hypothyroidism  . Dupuytren's contracture   . Vitamin B12 deficiency   . Macular degeneration   . Barrett's esophagus   . Elevated MCV     , B12 and normal b12  . Hypothyroidism   . Wears glasses     Past Surgical History  Procedure Laterality Date  . Skin cancer  excision      Left ear  . Tonsillectomy      age 13 1/2  . Colonoscopy w/ polypectomy      at 80yo---benign  . Esophagogastroduodenoscopy    . Cataract extraction      both cataracts    Family History  Problem Relation Age of Onset  . Lung cancer Father   . Lung cancer Brother    Social History:  reports that he quit smoking about 25 years ago. He does not have any smokeless tobacco history on file. He reports that he drinks alcohol. He reports that he does not use illicit drugs.  Allergies:  Allergies  Allergen Reactions  . Hydrochlorothiazide Other (See Comments)    hyponatremia    No prescriptions prior to admission    Results for orders placed during the hospital encounter of 05/02/14 (from the past 48 hour(s))  BASIC METABOLIC PANEL     Status: Abnormal   Collection Time    05/01/14  9:00 AM      Result Value Ref Range   Sodium 138  137 - 147 mEq/L   Potassium 3.8  3.7 - 5.3 mEq/L   Chloride 101  96 - 112 mEq/L   CO2 25  19 - 32 mEq/L   Glucose, Bld 97  70 - 99 mg/dL   BUN 8  6 - 23  mg/dL   Creatinine, Ser 0.72  0.50 - 1.35 mg/dL   Calcium 8.5  8.4 - 10.5 mg/dL   GFR calc non Af Amer 87 (*) >90 mL/min   GFR calc Af Amer >90  >90 mL/min   Comment: (NOTE)     The eGFR has been calculated using the CKD EPI equation.     This calculation has not been validated in all clinical situations.     eGFR's persistently <90 mL/min signify possible Chronic Kidney     Disease.    No results found.   Pertinent items are noted in HPI.  Height 5' 8"  (1.727 m), weight 82.555 kg (182 lb).  General appearance: alert, cooperative and appears stated age Head: Normocephalic, without obvious abnormality Neck: no JVD Resp: clear to auscultation bilaterally Cardio: regular rate and rhythm, S1, S2 normal, no murmur, click, rub or gallop GI: soft, non-tender; bowel sounds normal; no masses,  no organomegaly Extremities: extremities normal, atraumatic, no cyanosis or  edema Pulses: 2+ and symmetric Skin: Skin color, texture, turgor normal. No rashes or lesions Neurologic: Grossly normal Incision/Wound: na  Assessment/Plan X-rays reveal mild degenerative changes PIP and DIP joints.  Diagnosis: Dupuytren's contractures with degenerative arthritis.  We have had a long discussion regarding the etiology of his Dupuytren's. We have discussed fasciotomy, fasciectomy, Xiaflex and aponeurotomies with him. The risks and benefits of each are discussed. He is advised that we can try to reconstruct his left small finger to equate to his right small finger with improved MCP joint motion. He is advised that it is unlikely to see any significant improvement to the PIP joint in that these are significantly contracted and even with fasciectomy and joint release would be a 50% improvement. This however would require significant rehabilitation and I am not certain at his age he desires proceeding to have that done. He would like to proceed. He will be scheduled for fasciotomies of the small finger first web space and thumb on the left side. The pre, peri and post op course are discussed along with risks and complications.  He is advised there is no guarantee with surgery, possibility of infection, recurrence, injury to arteries, nerves and tendons, incomplete relief of symptoms, possibility of loss of fingers and dystrophy. He has also discussed the possibility of amputating the finger and we would not rush him into doing that at this time. He is advised of open areas and we will not skin graft these. He is advised of the potential for continued contractures occurring. Questions were invited and answered in detail. This is to his left side thumb, first web space and small finger.    Wynonia Sours 05/02/2014, 7:50 AM

## 2014-05-02 NOTE — Transfer of Care (Signed)
Immediate Anesthesia Transfer of Care Note  Patient: [name]  Procedure(s) Performed: Procedure(s): FASCIOTOMY LEFT THUMB 1ST WEB SMALL FINGER  (Left)  Patient Location: PACU  Anesthesia Type:GA combined with regional for post-op pain  Level of Consciousness: awake, alert , oriented and patient cooperative  Airway & Oxygen Therapy: Patient Spontanous Breathing and Patient connected to face mask oxygen  Post-op Assessment: Report given to PACU RN and Post -op Vital signs reviewed and stable  Post vital signs: Reviewed and stable  Complications: No apparent anesthesia complications

## 2014-05-02 NOTE — Discharge Instructions (Addendum)

## 2014-05-02 NOTE — Anesthesia Procedure Notes (Addendum)
Anesthesia Regional Block:  Axillary brachial plexus block  Pre-Anesthetic Checklist: ,, timeout performed, Correct Patient, Correct Site, Correct Laterality, Correct Procedure, Correct Position, site marked, Risks and benefits discussed,  Surgical consent,  Pre-op evaluation,  At surgeon's request and post-op pain management  Laterality: Left and Upper  Prep: chloraprep       Needles:  Injection technique: Single-shot  Needle Type: Other   (#22 ga blunt "B" bevel needle)    Needle Gauge: 22 and 22 G    Additional Needles:  Procedures: paresthesia technique Axillary brachial plexus block  Nerve Stimulator or Paresthesia:  Response: transient ulnar nerve paresthesia,  Response: transient median nerve paresthesia,   Additional Responses:   Narrative:  Start time: 05/02/2014 9:49 AM End time: 05/02/2014 9:58 AM Injection made incrementally with aspirations every 5 mL.  Events: blood aspirated  Performed by: Personally  Anesthesiologist: Jenita Seashore, MD  Additional Notes: Pt identified in Holding room.  Monitors applied. Working IV access confirmed. Sterile prep L axilla.  #22ga blunt "B" bevel to transient ulnar and median nerve paresthsiae.  Total 20cc 1.5% lidocaine wit 1:200k epi and 30cc 0.5% Bupivacaine with 1:200k epi injected incrementally after negative test doses, distributed around each paresthesia.  Patient asymptomatic, VSS, when heme aspirated, needle withdrawn, cleared, and repositioned, tolerated well.  Jenita Seashore, MD   Procedure Name: LMA Insertion Date/Time: 05/02/2014 10:12 AM Performed by: Marrianne Mood Pre-anesthesia Checklist: Patient identified, Emergency Drugs available, Suction available, Patient being monitored and Timeout performed Patient Re-evaluated:Patient Re-evaluated prior to inductionOxygen Delivery Method: Circle System Utilized Preoxygenation: Pre-oxygenation with 100% oxygen Intubation Type: IV induction Ventilation: Mask ventilation  without difficulty LMA: LMA inserted LMA Size: 5.0 Number of attempts: 1 Airway Equipment and Method: bite block Placement Confirmation: positive ETCO2 and breath sounds checked- equal and bilateral Tube secured with: Tape Dental Injury: Teeth and Oropharynx as per pre-operative assessment

## 2014-05-02 NOTE — Op Note (Signed)
Dictation Number [id number]

## 2014-05-02 NOTE — Anesthesia Postprocedure Evaluation (Signed)
  Anesthesia Post-op Note  Patient: [name]  Procedure(s) Performed: Procedure(s): FASCIOTOMY LEFT THUMB 1ST WEB SMALL FINGER  (Left)  Patient Location: PACU  Anesthesia Type:GA combined with regional for post-op pain  Level of Consciousness: awake, alert , oriented and patient cooperative  Airway and Oxygen Therapy: Patient Spontanous Breathing  Post-op Pain: none  Post-op Assessment: Post-op Vital signs reviewed, Patient's Cardiovascular Status Stable, Respiratory Function Stable, Patent Airway, No signs of Nausea or vomiting and Pain level controlled  Post-op Vital Signs: Reviewed and stable  Last Vitals:  Filed Vitals:   05/02/14 1130  BP: 125/55  Pulse: 69  Temp:   Resp: 16    Complications: No apparent anesthesia complications

## 2014-05-02 NOTE — Progress Notes (Signed)
Assisted Dr. Glennon Mac with left, axillary block. Side rails up, monitors on throughout procedure. See vital signs in flow sheet. Tolerated Procedure well.

## 2014-05-02 NOTE — Op Note (Signed)
[name], [name] NO.:  [id number]  MEDICAL RECORD NO.:  [id number]  LOCATION:                                 FACILITY:  PHYSICIAN:  Daryll Brod, M.D.       DATE OF BIRTH:  June 08, 1934  DATE OF PROCEDURE:  05/02/2014 DATE OF DISCHARGE:                              OPERATIVE REPORT   PREOPERATIVE DIAGNOSES:  Dupuytren contracture, left thumb first web space small finger.  POSTOPERATIVE DIAGNOSIS:  Dupuytren contracture, left thumb first web space small finger.  OPERATION:  Fasciotomy with release of proximal interphalangeal joint of little finger, fasciotomy of left thumb, fasciotomy first web space, left hand.  SURGEON:  Daryll Brod, MD  ANESTHESIA:  Axillary block with general.  ANESTHESIOLOGIST:  Dr. Glennon Mac.  HISTORY:  The patient is a 80 year old male with a history of Dupuytren contracture 90 degree flexion deformity to the DIP joint of his left small finger.  He had a contracture of his thumb and first web space. He has had last flex injection 2 years ago, this has recurred.  He is desirous of further intervention.  He has elected to undergo a fasciotomy rather than fasciectomy secondary to the degree of dissection.  He is aware that there is no guarantee with surgery, possibility of infection, recurrence of injury to arteries, nerves, tendons, incomplete relief of symptoms, and dystrophy.  DESCRIPTION OF PROCEDURE:  In the preoperative area, the patient is seen, the extremity marked by both patient and surgeon.  Antibiotic given.  The axillary block was carried out under the direction of Dr. Glennon Mac.  He was then brought to the operating room, where he was prepped and draped.  General anesthetic given.  Prep was done with ChloraPrep, supine position with the left arm free.  A 3-minute dry time was allowed.  Time-out taken confirming the patient and procedure.  The limb was exsanguinated with an Esmarch bandage.  Tourniquet placed high on the  arm was inflated to 250 mmHg.  The thumb was attended to first. An incision made on thenar eminence.  A very large thick cord was immediately apparent.  This was isolated and transected with sharp dissection and blunt dissection taking care to prevent injury to neurovascular structures.  A second incision was then made over the proximal phalanx radial aspect.  Again, the cord was dissected free. The radial neurovascular bundle identified.  This cord was then transected and a segment sent to Pathology.  The thumb came fully straight at both MP and IP joint at that point in time.  A separate incision was then made transversely over the first web space.  The thick cord was isolated.  This was transected allowing the thumb to be abducted.  The small finger was attended to next.  A transverse incision made over the proximal aspect of the palmar fascia.  The cord isolated to the ring and small fingers.  Retractors placed.  Neurovascular structures protected and the cord was cut at this point in time.  This allowed the metacarpophalangeal joint to the small finger to become straight.  A separate incision was then made over the proximal phalanx radial aspect of the small finger.  The  neurovascular structures on the radial side were identified, protected, and a very thick cord on the radial aspect of the small finger was transected.  This allowed the finger to come to approximately 50 degrees of flexion.  The neurovascular structures radially and ulnarly identified.  A transverse incision made in the flexor sheath, and the Swallowtails checkrein ligaments were released on both radial and ulnar aspect of the PIP joint.  The PIP joint came fully straight.  It was elected not to proceed with any treatment to the distal interphalangeal joint.  The wounds were copiously irrigated with saline.  These were minimally closed as much as possible with interrupted 4-0 Vicryl Rapide sutures. None of the wounds  were able to be fully closed.  Sterile compressive dressing, nonadherent gauze was placed along with a volar splint.  On deflation of the tourniquet, all fingers immediately pinked.  He was taken to the recovery room for observation in satisfactory condition. He will be discharged home to return to the [facility] in 1 week on Ultram.          ______________________________ Daryll Brod, M.D.     GK/MEDQ  D:  05/02/2014  T:  05/02/2014  Job:  [id number]

## 2014-05-03 ENCOUNTER — Other Ambulatory Visit (HOSPITAL_BASED_OUTPATIENT_CLINIC_OR_DEPARTMENT_OTHER): Payer: Medicare Other

## 2014-05-03 ENCOUNTER — Encounter (HOSPITAL_BASED_OUTPATIENT_CLINIC_OR_DEPARTMENT_OTHER): Payer: Self-pay | Admitting: Orthopedic Surgery

## 2014-05-03 DIAGNOSIS — D472 Monoclonal gammopathy: Secondary | ICD-10-CM

## 2014-05-03 LAB — CBC WITH DIFFERENTIAL/PLATELET
BASO%: 0.2 % (ref 0.0–2.0)
Basophils Absolute: 0 10*3/uL (ref 0.0–0.1)
EOS%: 0 % (ref 0.0–7.0)
Eosinophils Absolute: 0 10*3/uL (ref 0.0–0.5)
HEMATOCRIT: 32 % — AB (ref 38.4–49.9)
HGB: 11.2 g/dL — ABNORMAL LOW (ref 13.0–17.1)
LYMPH#: 2.6 10*3/uL (ref 0.9–3.3)
LYMPH%: 27.6 % (ref 14.0–49.0)
MCH: 37.9 pg — ABNORMAL HIGH (ref 27.2–33.4)
MCHC: 35 g/dL (ref 32.0–36.0)
MCV: 108.4 fL — ABNORMAL HIGH (ref 79.3–98.0)
MONO#: 0.9 10*3/uL (ref 0.1–0.9)
MONO%: 9.8 % (ref 0.0–14.0)
NEUT#: 5.9 10*3/uL (ref 1.5–6.5)
NEUT%: 62.4 % (ref 39.0–75.0)
Platelets: 118 10*3/uL — ABNORMAL LOW (ref 140–400)
RBC: 2.95 10*6/uL — AB (ref 4.20–5.82)
RDW: 12.7 % (ref 11.0–14.6)
WBC: 9.5 10*3/uL (ref 4.0–10.3)

## 2014-05-03 LAB — COMPREHENSIVE METABOLIC PANEL (CC13)
ALT: 19 U/L (ref 0–55)
ANION GAP: 10 meq/L (ref 3–11)
AST: 31 U/L (ref 5–34)
Albumin: 3.2 g/dL — ABNORMAL LOW (ref 3.5–5.0)
Alkaline Phosphatase: 44 U/L (ref 40–150)
BUN: 13.4 mg/dL (ref 7.0–26.0)
CALCIUM: 8.4 mg/dL (ref 8.4–10.4)
CHLORIDE: 104 meq/L (ref 98–109)
CO2: 20 meq/L — AB (ref 22–29)
Creatinine: 0.8 mg/dL (ref 0.7–1.3)
Glucose: 117 mg/dl (ref 70–140)
Potassium: 3.7 mEq/L (ref 3.5–5.1)
SODIUM: 133 meq/L — AB (ref 136–145)
TOTAL PROTEIN: 8.5 g/dL — AB (ref 6.4–8.3)
Total Bilirubin: 1.01 mg/dL (ref 0.20–1.20)

## 2014-05-08 LAB — PROTEIN ELECTROPHORESIS, SERUM
ALPHA-2-GLOBULIN: 8.1 % (ref 7.1–11.8)
Albumin ELP: 44 % — ABNORMAL LOW (ref 55.8–66.1)
Alpha-1-Globulin: 3 % (ref 2.9–4.9)
BETA 2: 2.3 % — AB (ref 3.2–6.5)
Beta Globulin: 4.7 % (ref 4.7–7.2)
GAMMA GLOBULIN: 37.9 % — AB (ref 11.1–18.8)
M-SPIKE, %: 2.86 g/dL
Total Protein, Serum Electrophoresis: 8.3 g/dL (ref 6.0–8.3)

## 2014-05-08 LAB — KAPPA/LAMBDA LIGHT CHAINS
KAPPA FREE LGHT CHN: 2.86 mg/dL — AB (ref 0.33–1.94)
Kappa:Lambda Ratio: 3.81 — ABNORMAL HIGH (ref 0.26–1.65)
Lambda Free Lght Chn: 0.75 mg/dL (ref 0.57–2.63)

## 2014-05-10 ENCOUNTER — Telehealth: Payer: Self-pay | Admitting: Oncology

## 2014-05-10 ENCOUNTER — Ambulatory Visit (HOSPITAL_BASED_OUTPATIENT_CLINIC_OR_DEPARTMENT_OTHER): Payer: Medicare Other | Admitting: Oncology

## 2014-05-10 VITALS — BP 146/58 | HR 59 | Temp 98.6°F | Resp 18 | Ht 68.0 in | Wt 187.6 lb

## 2014-05-10 DIAGNOSIS — D472 Monoclonal gammopathy: Secondary | ICD-10-CM

## 2014-05-10 DIAGNOSIS — I208 Other forms of angina pectoris: Secondary | ICD-10-CM

## 2014-05-10 NOTE — Telephone Encounter (Signed)
gv and printed appt sched and avs for pt for June... °

## 2014-05-10 NOTE — Progress Notes (Signed)
  [place] OFFICE PROGRESS NOTE   Diagnosis: Monoclonal gammopathy  INTERVAL HISTORY:   He returns as scheduled. No recent infection. No new complaint. He underwent Dupuytren's contracture surgery last week.  Objective:  Vital signs in last 24 hours:  Blood pressure 146/58, pulse 59, temperature 98.6 F (37 C), temperature source Oral, resp. rate 18, height $RemoveBe'5\' 8"'[id number]$  (1.727 m), weight 187 lb 9.6 oz (85.095 kg).    HEENT: Neck without mass Lymphatics: No cervical, supraclavicular, axillary, or inguinal nodes Resp: Lungs clear bilaterally Cardio: Regular rate and rhythm GI: No hepatosplenomegaly Vascular: Trace edema with chronic stasis change at the left greater than right lower leg  Breast: Bilateral gynecomastia    Lab Results:  Lab Results  Component Value Date   WBC 9.5 05/03/2014   HGB 11.2* 05/03/2014   HCT 32.0* 05/03/2014   MCV 108.4* 05/03/2014   PLT 118* 05/03/2014   NEUTROABS 5.9 05/03/2014   Serum M spike 2.86, free kappa light chains 2.86  12/27/2013-beta-2 microglobulin 2.93  Imaging:  No results found.  Medications: I have reviewed the patient's current medications.  Assessment/Plan: 1.Elevated IgG level with a monoclonal protein demonstrated on previous serum protein electrophoresis studies. The monoclonal gammopathy dates to 2008  2. Red cell macrocytosis  3. Moderate alcohol use  4. "Neuropathy "of the feet  5. Mild lymphocytosis -negative peripheral blood flow cytometry 12/27/2013   Disposition:  [name] returns for followup of a monoclonal gammopathy. The hemoglobin was slightly lower last week and the serum M spike is slightly higher. The hematologic findings may be related to chronic liver disease or a lymphoproliferative disorder. We decided to proceed with a diagnostic bone marrow biopsy. This will be scheduled in radiology for within the next one to 2 weeks. He will return for an office visit after the bone marrow  biopsy.  Ladell Pier, MD  05/10/2014  10:14 AM

## 2014-05-14 ENCOUNTER — Telehealth: Payer: Self-pay | Admitting: Oncology

## 2014-05-14 ENCOUNTER — Telehealth: Payer: Self-pay | Admitting: *Deleted

## 2014-05-14 NOTE — Telephone Encounter (Signed)
Bone marrow biopsy on 6/10. Asking if OK for him to travel to [place] (auto) the next day?  Per Dr. Benay Spice: OK to travel. Suggested he have extra pillow and can take ibuprofen if necessary.

## 2014-05-14 NOTE — Telephone Encounter (Signed)
pt called today to inform us that bx is not until 6/10 and that 6/9 f/u needs to be moved to a wk after bx. messag to BS for new d/t. pt aware.

## 2014-05-16 ENCOUNTER — Telehealth: Payer: Self-pay | Admitting: Oncology

## 2014-05-16 ENCOUNTER — Ambulatory Visit (HOSPITAL_COMMUNITY): Payer: Medicare Other

## 2014-05-16 NOTE — Telephone Encounter (Signed)
per response from BS pt 6/9 f/u moved to 6/16. lmonvm for pt re new d/t. schedule mailed.

## 2014-05-21 ENCOUNTER — Encounter (HOSPITAL_COMMUNITY): Payer: Self-pay | Admitting: Pharmacy Technician

## 2014-05-22 ENCOUNTER — Ambulatory Visit: Payer: Medicare Other | Admitting: Oncology

## 2014-05-22 ENCOUNTER — Other Ambulatory Visit: Payer: Self-pay | Admitting: Radiology

## 2014-05-23 ENCOUNTER — Encounter (HOSPITAL_COMMUNITY): Payer: Self-pay

## 2014-05-23 ENCOUNTER — Ambulatory Visit (HOSPITAL_COMMUNITY)
Admission: RE | Admit: 2014-05-23 | Discharge: 2014-05-23 | Disposition: A | Discharge status: Home / Self Care | Payer: Medicare Other | Source: Ambulatory Visit | Attending: Oncology | Admitting: Oncology

## 2014-05-23 VITALS — BP 134/59 | HR 63 | Temp 98.0°F | Resp 16 | Ht 67.5 in | Wt 182.0 lb

## 2014-05-23 DIAGNOSIS — Z87891 Personal history of nicotine dependence: Secondary | ICD-10-CM | POA: Insufficient documentation

## 2014-05-23 DIAGNOSIS — D539 Nutritional anemia, unspecified: Secondary | ICD-10-CM | POA: Insufficient documentation

## 2014-05-23 DIAGNOSIS — E785 Hyperlipidemia, unspecified: Secondary | ICD-10-CM | POA: Insufficient documentation

## 2014-05-23 DIAGNOSIS — D472 Monoclonal gammopathy: Secondary | ICD-10-CM

## 2014-05-23 DIAGNOSIS — E039 Hypothyroidism, unspecified: Secondary | ICD-10-CM | POA: Insufficient documentation

## 2014-05-23 DIAGNOSIS — H3530 Unspecified macular degeneration: Secondary | ICD-10-CM | POA: Insufficient documentation

## 2014-05-23 DIAGNOSIS — D72820 Lymphocytosis (symptomatic): Secondary | ICD-10-CM | POA: Insufficient documentation

## 2014-05-23 DIAGNOSIS — D704 Cyclic neutropenia: Secondary | ICD-10-CM | POA: Insufficient documentation

## 2014-05-23 DIAGNOSIS — D696 Thrombocytopenia, unspecified: Secondary | ICD-10-CM | POA: Insufficient documentation

## 2014-05-23 DIAGNOSIS — D499 Neoplasm of unspecified behavior of unspecified site: Secondary | ICD-10-CM | POA: Insufficient documentation

## 2014-05-23 DIAGNOSIS — I10 Essential (primary) hypertension: Secondary | ICD-10-CM | POA: Insufficient documentation

## 2014-05-23 DIAGNOSIS — E8809 Other disorders of plasma-protein metabolism, not elsewhere classified: Secondary | ICD-10-CM | POA: Insufficient documentation

## 2014-05-23 DIAGNOSIS — K219 Gastro-esophageal reflux disease without esophagitis: Secondary | ICD-10-CM | POA: Insufficient documentation

## 2014-05-23 LAB — PROTIME-INR
INR: 1.06 (ref 0.00–1.49)
Prothrombin Time: 13.6 seconds (ref 11.6–15.2)

## 2014-05-23 LAB — CBC
HEMATOCRIT: 34.7 % — AB (ref 39.0–52.0)
Hemoglobin: 12.5 g/dL — ABNORMAL LOW (ref 13.0–17.0)
MCH: 37.7 pg — AB (ref 26.0–34.0)
MCHC: 36 g/dL (ref 30.0–36.0)
MCV: 104.5 fL — AB (ref 78.0–100.0)
PLATELETS: 128 10*3/uL — AB (ref 150–400)
RBC: 3.32 MIL/uL — ABNORMAL LOW (ref 4.22–5.81)
RDW: 12.5 % (ref 11.5–15.5)
WBC: 7.6 10*3/uL (ref 4.0–10.5)

## 2014-05-23 LAB — BONE MARROW EXAM

## 2014-05-23 MED ORDER — FENTANYL CITRATE 0.05 MG/ML IJ SOLN
INTRAMUSCULAR | Status: AC
  Filled 2014-05-23: qty 2

## 2014-05-23 MED ORDER — SODIUM CHLORIDE 0.9 % IV SOLN
INTRAVENOUS | Status: DC
  Administered 2014-05-23: 09:00:00 via INTRAVENOUS

## 2014-05-23 MED ORDER — MIDAZOLAM HCL 2 MG/2ML IJ SOLN
INTRAMUSCULAR | Status: AC
  Filled 2014-05-23: qty 6

## 2014-05-23 MED ORDER — HYDROCODONE-ACETAMINOPHEN 5-325 MG PO TABS
1.0000 | ORAL_TABLET | ORAL | Status: DC | PRN
  Filled 2014-05-23: qty 2

## 2014-05-23 MED ORDER — FENTANYL CITRATE 0.05 MG/ML IJ SOLN
INTRAMUSCULAR | Status: AC | PRN
  Administered 2014-05-23 (×3): 25 ug via INTRAVENOUS

## 2014-05-23 MED ORDER — MIDAZOLAM HCL 2 MG/2ML IJ SOLN
INTRAMUSCULAR | Status: AC | PRN
  Administered 2014-05-23 (×3): 1 mg via INTRAVENOUS

## 2014-05-23 NOTE — Procedures (Signed)
Interventional Radiology Procedure Note  Procedure: CT guided aspirate and core biopsy of right iliac bone Complications: None Recommendations: - Bedrest supine x 2 hrs - Hydrocodone PRN  Pain - Follow biopsy results  Signed,  Heath K. McCullough, MD [facility]   

## 2014-05-23 NOTE — Discharge Instructions (Signed)
Moderate Sedation, Adult °Moderate sedation is given to help you relax or even sleep through a procedure. You may remain sleepy, be clumsy, or have poor balance for several hours following this procedure. Arrange for a responsible adult, family member, or friend to take you home. A responsible adult should stay with you for at least 24 hours or until the medicines have worn off. °· Do not participate in any activities where you could become injured for the next 24 hours, or until you feel normal again. Do not: °· Drive. °· Swim. °· Ride a bicycle. °· Operate heavy machinery. °· Cook. °· Use power tools. °· Climb ladders. °· Work at heights. °· Do not make important decisions or sign legal documents until you are improved. °· Vomiting may occur if you eat too soon. When you can drink without vomiting, try water, juice, or soup. Try solid foods if you feel little or no nausea. °· Only take over-the-counter or prescription medications for pain, discomfort, or fever as directed by your caregiver.If pain medications have been prescribed for you, ask your caregiver how soon it is safe to take them. °· Make sure you and your family fully understands everything about the medication given to you. Make sure you understand what side effects may occur. °· You should not drink alcohol, take sleeping pills, or medications that cause drowsiness for at least 24 hours. °· If you smoke, do not smoke alone. °· If you are feeling better, you may resume normal activities 24 hours after receiving sedation. °· Keep all appointments as scheduled. Follow all instructions. °· Ask questions if you do not understand. °SEEK MEDICAL CARE IF:  °· Your skin is pale or bluish in color. °· You continue to feel sick to your stomach (nauseous) or throw up (vomit). °· Your pain is getting worse and not helped by medication. °· You have bleeding or swelling. °· You are still sleepy or feeling clumsy after 24 hours. °SEEK IMMEDIATE MEDICAL CARE IF:   °· You develop a rash. °· You have difficulty breathing. °· You develop any type of allergic problem. °· You have a fever. °Document Released: 08/25/2001 Document Revised: 02/22/2012 Document Reviewed: 08/07/2013 °ExitCare® Patient Information ©2014 [employer]. °Bone Marrow Aspiration, Bone Marrow Biopsy °Care After °Read the instructions outlined below and refer to this sheet in the next few weeks. These discharge instructions provide you with general information on caring for yourself after you leave the hospital. Your caregiver may also give you specific instructions. While your treatment has been planned according to the most current medical practices available, unavoidable complications occasionally occur. If you have any problems or questions after discharge, call your caregiver. °FINDING OUT THE RESULTS OF YOUR TEST °Not all test results are available during your visit. If your test results are not back during the visit, make an appointment with your caregiver to find out the results. Do not assume everything is normal if you have not heard from your caregiver or the medical facility. It is important for you to follow up on all of your test results.  °HOME CARE INSTRUCTIONS  °You have had sedation and may be sleepy or dizzy. Your thinking may not be as clear as usual. For the next 24 hours: °· Only take over-the-counter or prescription medicines for pain, discomfort, and or fever as directed by your caregiver. °· Do not drink alcohol. °· Do not smoke. °· Do not drive. °· Do not make important legal decisions. °· Do not operate heavy machinery. °·   Do not care for small children by yourself. °· Keep your dressing clean and dry. You may replace dressing with a bandage after 24 hours. °· You may take a bath or shower after 24 hours. °· Use an ice pack for 20 minutes every 2 hours while awake for pain as needed. °SEEK MEDICAL CARE IF:  °· There is redness, swelling, or increasing pain at the biopsy  site. °· There is pus coming from the biopsy site. °· There is drainage from a biopsy site lasting longer than one day. °· An unexplained oral temperature above 102° F (38.9° C) develops. °SEEK IMMEDIATE MEDICAL CARE IF:  °· You develop a rash. °· You have difficulty breathing. °· You develop any reaction or side effects to medications given. °Document Released: 06/19/2005 Document Revised: 02/22/2012 Document Reviewed: 11/27/2008 °ExitCare® Patient Information ©2014 [employer]. ° °

## 2014-05-23 NOTE — H&P (Signed)
Chief Complaint: "I'm here for a bone marrow biopsy" Referring Physician:Sherrill HPI: [name] is an 80 y.o. male with hx of monoclonal gammopathy. He is now scheduled for bone marrow biopsy. PMHx, meds reviewed.  Past Medical History:  Past Medical History  Diagnosis Date  . Hypertension   . Monoclonal paraproteinemia   . Hyperlipemia   . GERD (gastroesophageal reflux disease)   . Irritable bowel syndrome (IBS)   . Thyroid disease     Hypothyroidism  . Dupuytren's contracture   . Vitamin B12 deficiency   . Macular degeneration   . Barrett's esophagus   . Elevated MCV     , B12 and normal b12  . Hypothyroidism   . Wears glasses     Past Surgical History:  Past Surgical History  Procedure Laterality Date  . Skin cancer excision      Left ear  . Tonsillectomy      age 53 1/2  . Colonoscopy w/ polypectomy      at 80yo---benign  . Esophagogastroduodenoscopy    . Cataract extraction      both cataracts  . Fasciotomy Left 05/02/2014    Procedure: FASCIOTOMY LEFT THUMB 1ST WEB SMALL FINGER ;  Surgeon: Wynonia Sours, MD;  Location: [facility];  Service: Orthopedics;  Laterality: Left;    Family History:  Family History  Problem Relation Age of Onset  . Lung cancer Father   . Lung cancer Brother     Social History:  reports that he quit smoking about 25 years ago. He does not have any smokeless tobacco history on file. He reports that he drinks alcohol. He reports that he does not use illicit drugs.  Allergies:  Allergies  Allergen Reactions  . Hydrochlorothiazide Other (See Comments)    hyponatremia    Medications:   Medication List    ASK your doctor about these medications       aspirin 81 MG tablet  Take 81 mg by mouth daily.     atenolol 50 MG tablet  Commonly known as:  TENORMIN  Take 75 mg by mouth every morning.     furosemide 20 MG tablet  Commonly known as:  LASIX  Take 20 mg by mouth every morning.     ibuprofen 800 MG  tablet  Commonly known as:  ADVIL,MOTRIN  Take 800 mg by mouth every 8 (eight) hours as needed for mild pain.     pantoprazole 40 MG tablet  Commonly known as:  PROTONIX  Take 40 mg by mouth daily.     PRESERVISION AREDS PO  Take 1 capsule by mouth 2 (two) times daily.     simvastatin 20 MG tablet  Commonly known as:  ZOCOR  Take 20 mg by mouth at bedtime.     SYNTHROID 137 MCG tablet  Generic drug:  levothyroxine  Take 137 mcg by mouth daily before breakfast.        Please HPI for pertinent positives, otherwise complete 10 system ROS negative.  Physical Exam: BP 153/61  Pulse 73  Temp(Src) 98.5 F (36.9 C) (Oral)  Resp 16  Ht 5' 7.5" (1.715 m)  Wt 182 lb (82.555 kg)  BMI 28.07 kg/m2  SpO2 99% Body mass index is 28.07 kg/(m^2).   General Appearance:  Alert, cooperative, no distress, appears stated age  Head:  Normocephalic, without obvious abnormality, atraumatic  ENT: Unremarkable  Neck: Supple, symmetrical, trachea midline  Lungs:   Clear to auscultation bilaterally, no w/r/r, respirations  unlabored without use of accessory muscles.  Chest Wall:  No tenderness or deformity  Heart:  Regular rate and rhythm, S1, S2 normal, no murmur, rub or gallop.  Neurologic: Normal affect, no gross deficits.   Results for orders placed during the hospital encounter of 05/23/14 (from the past 48 hour(s))  CBC     Status: Abnormal   Collection Time    05/23/14  9:20 AM      Result Value Ref Range   WBC 7.6  4.0 - 10.5 K/uL   RBC 3.32 (*) 4.22 - 5.81 MIL/uL   Hemoglobin 12.5 (*) 13.0 - 17.0 g/dL   HCT 34.7 (*) 39.0 - 52.0 %   MCV 104.5 (*) 78.0 - 100.0 fL   MCH 37.7 (*) 26.0 - 34.0 pg   MCHC 36.0  30.0 - 36.0 g/dL   RDW 12.5  11.5 - 15.5 %   Platelets 128 (*) 150 - 400 K/uL  PROTIME-INR     Status: None   Collection Time    05/23/14  9:20 AM      Result Value Ref Range   Prothrombin Time 13.6  11.6 - 15.2 seconds   INR 1.06  0.00 - 1.49   No results  found.  Assessment/Plan Monoclonal gammopathy For CT guided bone marrow biopsy Discussed procedure, risks, complications, use of sedation. Labs reviewed. Consent signed in chart  Ascencion Dike PA-C 05/23/2014, 10:30 AM

## 2014-05-24 ENCOUNTER — Encounter (INDEPENDENT_AMBULATORY_CARE_PROVIDER_SITE_OTHER): Payer: Medicare Other | Admitting: Ophthalmology

## 2014-05-28 ENCOUNTER — Encounter (INDEPENDENT_AMBULATORY_CARE_PROVIDER_SITE_OTHER): Payer: Medicare Other | Admitting: Ophthalmology

## 2014-05-28 DIAGNOSIS — I10 Essential (primary) hypertension: Secondary | ICD-10-CM

## 2014-05-28 DIAGNOSIS — H353290 Exudative age-related macular degeneration, unspecified eye, stage unspecified: Secondary | ICD-10-CM

## 2014-05-28 DIAGNOSIS — H3530 Unspecified macular degeneration: Secondary | ICD-10-CM

## 2014-05-28 DIAGNOSIS — H35039 Hypertensive retinopathy, unspecified eye: Secondary | ICD-10-CM

## 2014-05-28 DIAGNOSIS — H43819 Vitreous degeneration, unspecified eye: Secondary | ICD-10-CM

## 2014-05-29 ENCOUNTER — Ambulatory Visit (HOSPITAL_BASED_OUTPATIENT_CLINIC_OR_DEPARTMENT_OTHER): Payer: Medicare Other | Admitting: Oncology

## 2014-05-29 ENCOUNTER — Telehealth: Payer: Self-pay | Admitting: Oncology

## 2014-05-29 VITALS — BP 147/64 | HR 64 | Temp 97.2°F | Resp 18 | Ht 67.5 in | Wt 186.0 lb

## 2014-05-29 DIAGNOSIS — D472 Monoclonal gammopathy: Secondary | ICD-10-CM

## 2014-05-29 DIAGNOSIS — G609 Hereditary and idiopathic neuropathy, unspecified: Secondary | ICD-10-CM

## 2014-05-29 DIAGNOSIS — C9000 Multiple myeloma not having achieved remission: Secondary | ICD-10-CM

## 2014-05-29 NOTE — Telephone Encounter (Signed)
, °

## 2014-05-29 NOTE — Progress Notes (Signed)
  [facility] OFFICE PROGRESS NOTE   Diagnosis: Multiple myeloma  INTERVAL HISTORY:   [name] returns as scheduled. He feels well. No specific complaint. He underwent surgery for correction of a Dupuytren contracture of the left thumb on 05/02/2014.  He underwent a bone marrow biopsy 05/23/2014 and the pathology ([id number]) revealed a hypercellular bone marrow with increased plasma cells representing 59% of all cells. Immunohistochemical stains showed kappa light chain restriction. Review of the peripheral blood showed a lymphocytosis. A significant lymphocyte component was not seen in the bone marrow by morphology. A cytogenetics analysis is pending.  Objective:  Vital signs in last 24 hours:  Blood pressure 147/64, pulse 64, temperature 97.2 F (36.2 C), temperature source Oral, resp. rate 18, height 5' 7.5" (1.715 m), weight 186 lb (84.369 kg), SpO2 99.00%.   Resp: Lungs clear bilaterally Cardio: Regular in rhythm GI: No hepatosplenomegaly Vascular: Trace edema with chronic stasis change at the left lower leg  Skin: Bone marrow site without evidence of infection   Portacath/PICC-without erythema  Lab Results:  Lab Results  Component Value Date   WBC 7.6 05/23/2014   HGB 12.5* 05/23/2014   HCT 34.7* 05/23/2014   MCV 104.5* 05/23/2014   PLT 128* 05/23/2014   NEUTROABS 5.9 05/03/2014    Lab Results  Component Value Date   NA 133* 05/03/2014    No results found for this basename: CEA,  CA199,  CA125    Imaging:  No results found.  Medications: I have reviewed the patient's current medications.  Assessment/Plan: 1. Multiple myeloma  Elevated IgG level with a monoclonal protein.. The monoclonal gammopathy dates to 2008   Bone marrow biopsy 05/23/2014 consistent with multiple myeloma, 59% plasma cells-kappa light chain restricted 2. Red cell macrocytosis  3. Moderate alcohol use  4. "Neuropathy "of the feet -he describes "burning" of the feet for  the past 20 years 5. Mild lymphocytosis -negative peripheral blood flow cytometry 12/27/2013    Disposition:  The bone marrow biopsy is consistent with a diagnosis of multiple myeloma. He appears asymptomatic from the myeloma. He does not have significant anemia or thrombocytopenia. There is no renal failure or hypercalcemia. He will be referred for a metastatic bone survey today.  Mr. Tetrick appears to have "smoldering " myeloma. We discussed observation versus initiating systemic therapy. We decided to continue observation for now with the plan to begin systemic therapy if he develops new laboratory abnormalities or symptoms related to myeloma. I doubt the foot "burning" is related to the diagnosis of multiple myeloma as this symptom has been present for 20 years.  He will return for an office visit and repeat laboratory evaluation in 3 months.  [name], MD  05/29/2014  9:52 AM

## 2014-05-31 ENCOUNTER — Ambulatory Visit (HOSPITAL_COMMUNITY)
Admission: RE | Admit: 2014-05-31 | Discharge: 2014-05-31 | Disposition: A | Discharge status: Home / Self Care | Payer: Medicare Other | Source: Ambulatory Visit | Attending: Oncology | Admitting: Oncology

## 2014-05-31 DIAGNOSIS — I7090 Unspecified atherosclerosis: Secondary | ICD-10-CM | POA: Insufficient documentation

## 2014-05-31 DIAGNOSIS — C9000 Multiple myeloma not having achieved remission: Secondary | ICD-10-CM | POA: Insufficient documentation

## 2014-05-31 DIAGNOSIS — D472 Monoclonal gammopathy: Secondary | ICD-10-CM | POA: Insufficient documentation

## 2014-06-01 LAB — CHROMOSOME ANALYSIS, BONE MARROW

## 2014-06-05 ENCOUNTER — Telehealth: Payer: Self-pay | Admitting: *Deleted

## 2014-06-05 NOTE — Telephone Encounter (Signed)
Per Dr. Benay Spice; notified pt that xrays are negative for myeloma.  Pt verbalized understanding and confirmed appt for 09/03/14.

## 2014-06-05 NOTE — Telephone Encounter (Signed)
Message copied by Domenic Schwab on Tue Jun 05, 2014  9:06 AM ------      Message from: Ladell Pier      Created: Sun Jun 03, 2014  5:56 PM       Please call patient, Hoover Brunette are negative for myeloma ------

## 2014-06-11 ENCOUNTER — Ambulatory Visit: Payer: Medicare Other | Admitting: Oncology

## 2014-07-02 ENCOUNTER — Encounter (INDEPENDENT_AMBULATORY_CARE_PROVIDER_SITE_OTHER): Payer: Medicare Other | Admitting: Ophthalmology

## 2014-07-02 DIAGNOSIS — H3530 Unspecified macular degeneration: Secondary | ICD-10-CM

## 2014-07-02 DIAGNOSIS — H353290 Exudative age-related macular degeneration, unspecified eye, stage unspecified: Secondary | ICD-10-CM

## 2014-07-02 DIAGNOSIS — I10 Essential (primary) hypertension: Secondary | ICD-10-CM

## 2014-07-02 DIAGNOSIS — H43819 Vitreous degeneration, unspecified eye: Secondary | ICD-10-CM

## 2014-08-06 ENCOUNTER — Encounter (INDEPENDENT_AMBULATORY_CARE_PROVIDER_SITE_OTHER): Payer: Medicare Other | Admitting: Ophthalmology

## 2014-08-06 DIAGNOSIS — H353290 Exudative age-related macular degeneration, unspecified eye, stage unspecified: Secondary | ICD-10-CM

## 2014-08-06 DIAGNOSIS — I10 Essential (primary) hypertension: Secondary | ICD-10-CM

## 2014-08-06 DIAGNOSIS — H35039 Hypertensive retinopathy, unspecified eye: Secondary | ICD-10-CM

## 2014-08-06 DIAGNOSIS — H3530 Unspecified macular degeneration: Secondary | ICD-10-CM

## 2014-08-06 DIAGNOSIS — H43819 Vitreous degeneration, unspecified eye: Secondary | ICD-10-CM

## 2014-08-08 ENCOUNTER — Encounter (HOSPITAL_COMMUNITY): Payer: Self-pay

## 2014-08-27 ENCOUNTER — Other Ambulatory Visit (HOSPITAL_BASED_OUTPATIENT_CLINIC_OR_DEPARTMENT_OTHER): Payer: Medicare Other

## 2014-08-27 DIAGNOSIS — D472 Monoclonal gammopathy: Secondary | ICD-10-CM

## 2014-08-27 DIAGNOSIS — C9000 Multiple myeloma not having achieved remission: Secondary | ICD-10-CM

## 2014-08-27 LAB — CBC WITH DIFFERENTIAL/PLATELET
BASO%: 0.9 % (ref 0.0–2.0)
Basophils Absolute: 0 10*3/uL (ref 0.0–0.1)
EOS%: 2.5 % (ref 0.0–7.0)
Eosinophils Absolute: 0.1 10*3/uL (ref 0.0–0.5)
HEMATOCRIT: 35.2 % — AB (ref 38.4–49.9)
HGB: 12.1 g/dL — ABNORMAL LOW (ref 13.0–17.1)
LYMPH%: 57 % — ABNORMAL HIGH (ref 14.0–49.0)
MCH: 38 pg — AB (ref 27.2–33.4)
MCHC: 34.5 g/dL (ref 32.0–36.0)
MCV: 110.2 fL — ABNORMAL HIGH (ref 79.3–98.0)
MONO#: 0.6 10*3/uL (ref 0.1–0.9)
MONO%: 12.1 % (ref 0.0–14.0)
NEUT#: 1.5 10*3/uL (ref 1.5–6.5)
NEUT%: 27.5 % — ABNORMAL LOW (ref 39.0–75.0)
PLATELETS: 102 10*3/uL — AB (ref 140–400)
RBC: 3.2 10*6/uL — AB (ref 4.20–5.82)
RDW: 13.1 % (ref 11.0–14.6)
WBC: 5.3 10*3/uL (ref 4.0–10.3)
lymph#: 3 10*3/uL (ref 0.9–3.3)

## 2014-08-27 LAB — BASIC METABOLIC PANEL (CC13)
Anion Gap: 9 mEq/L (ref 3–11)
BUN: 7.3 mg/dL (ref 7.0–26.0)
CALCIUM: 8.4 mg/dL (ref 8.4–10.4)
CO2: 21 mEq/L — ABNORMAL LOW (ref 22–29)
Chloride: 103 mEq/L (ref 98–109)
Creatinine: 0.8 mg/dL (ref 0.7–1.3)
Glucose: 149 mg/dl — ABNORMAL HIGH (ref 70–140)
Potassium: 3.4 mEq/L — ABNORMAL LOW (ref 3.5–5.1)
SODIUM: 133 meq/L — AB (ref 136–145)

## 2014-08-29 LAB — SPEP & IFE WITH QIG
ALBUMIN ELP: 41.9 % — AB (ref 55.8–66.1)
ALPHA-2-GLOBULIN: 8.8 % (ref 7.1–11.8)
Alpha-1-Globulin: 5.8 % — ABNORMAL HIGH (ref 2.9–4.9)
BETA GLOBULIN: 5.1 % (ref 4.7–7.2)
Beta 2: 2.2 % — ABNORMAL LOW (ref 3.2–6.5)
Gamma Globulin: 36.2 % — ABNORMAL HIGH (ref 11.1–18.8)
IgA: 92 mg/dL (ref 68–379)
IgG (Immunoglobin G), Serum: 3410 mg/dL — ABNORMAL HIGH (ref 650–1600)
IgM, Serum: 67 mg/dL (ref 41–251)
M-Spike, %: 2.83 g/dL
Total Protein, Serum Electrophoresis: 8.6 g/dL — ABNORMAL HIGH (ref 6.0–8.3)

## 2014-08-29 LAB — KAPPA/LAMBDA LIGHT CHAINS
KAPPA FREE LGHT CHN: 4.88 mg/dL — AB (ref 0.33–1.94)
Kappa:Lambda Ratio: 3.64 — ABNORMAL HIGH (ref 0.26–1.65)
LAMBDA FREE LGHT CHN: 1.34 mg/dL (ref 0.57–2.63)

## 2014-08-29 LAB — IGG: IGG (IMMUNOGLOBIN G), SERUM: 3410 mg/dL — AB (ref 650–1600)

## 2014-09-03 ENCOUNTER — Telehealth: Payer: Self-pay | Admitting: Oncology

## 2014-09-03 ENCOUNTER — Ambulatory Visit (HOSPITAL_BASED_OUTPATIENT_CLINIC_OR_DEPARTMENT_OTHER): Payer: Medicare Other | Admitting: Oncology

## 2014-09-03 VITALS — BP 139/50 | HR 69 | Temp 97.9°F | Resp 20 | Ht 67.5 in | Wt 181.6 lb

## 2014-09-03 DIAGNOSIS — D7589 Other specified diseases of blood and blood-forming organs: Secondary | ICD-10-CM

## 2014-09-03 DIAGNOSIS — D472 Monoclonal gammopathy: Secondary | ICD-10-CM

## 2014-09-03 DIAGNOSIS — F1020 Alcohol dependence, uncomplicated: Secondary | ICD-10-CM

## 2014-09-03 DIAGNOSIS — C9000 Multiple myeloma not having achieved remission: Secondary | ICD-10-CM

## 2014-09-03 DIAGNOSIS — L989 Disorder of the skin and subcutaneous tissue, unspecified: Secondary | ICD-10-CM

## 2014-09-03 DIAGNOSIS — D72820 Lymphocytosis (symptomatic): Secondary | ICD-10-CM

## 2014-09-03 NOTE — Progress Notes (Signed)
  [facility] OFFICE PROGRESS NOTE   Diagnosis: Multiple myeloma  INTERVAL HISTORY:   [name] returns as scheduled. No recent infection. No pain. He has a nonhealing scan the lesion at the bridge of the nose. He is scheduled to see dermatology in 2 weeks. He had a flareup of "reflux "2 weeks ago. He is scheduled to see Dr. Wynetta Emery. He relates recent weight loss to be reflux symptoms. Good appetite.  Objective:  Vital signs in last 24 hours:  Blood pressure 139/50, pulse 69, temperature 97.9 F (36.6 C), temperature source Oral, resp. rate 20, height 5' 7.5" (1.715 m), weight 181 lb 9.6 oz (82.373 kg).    Lymphatics: No cervical, supraclavicular, or axillary nodes Resp: Lungs clear bilaterally Cardio: Regular rate and rhythm GI: No hepatomegaly Vascular: Trace edema at the left lower leg.  Skin: 3-4 mm oval scan at the bridge of the nose    Lab Results:  Lab Results  Component Value Date   WBC 5.3 08/27/2014   HGB 12.1* 08/27/2014   HCT 35.2* 08/27/2014   MCV 110.2* 08/27/2014   PLT 102* 08/27/2014   NEUTROABS 1.5 08/27/2014   Creatinine 0.8, calcium 8.4  IgG 3410, serum M spike 2.3, serum free kappa light chains 4.88  Imaging:  No results found.  Medications: I have reviewed the patient's current medications.  Assessment/Plan: 1.Multiple myeloma  Elevated IgG level with a monoclonal protein.. The monoclonal gammopathy dates to 2008  Bone marrow biopsy 05/23/2014 consistent with multiple myeloma, 59% plasma cells-kappa light chain restricted Negative bone survey 05/31/2014 2. Red cell macrocytosis  3. Moderate alcohol use  4. "Neuropathy "of the feet -he describes "burning" of the feet for the past 20 years  5. Mild lymphocytosis -negative peripheral blood flow cytometry 12/27/2013    Disposition:  He remains asymptomatic from the multiple myeloma. He will followup with Dr. Ronnald Ramp to evaluate the skin lesion at the nose. He will see Dr. Felipa Eth  in approximately 2 weeks and remain up-to-date on the influenza/pneumococcal vaccines. He is scheduled to see Dr. Wynetta Emery for evaluation of reflux symptoms. We decided to continue observation for the multiple myeloma. He will return for an office and lab visit in 3 months  [name], MD  09/03/2014  10:32 AM

## 2014-09-03 NOTE — Telephone Encounter (Signed)
Pt confirmed labs/ov per 09/21 POF, gave pt AVS....KJ

## 2014-09-10 ENCOUNTER — Encounter (INDEPENDENT_AMBULATORY_CARE_PROVIDER_SITE_OTHER): Payer: Medicare Other | Admitting: Ophthalmology

## 2014-09-10 DIAGNOSIS — I10 Essential (primary) hypertension: Secondary | ICD-10-CM

## 2014-09-10 DIAGNOSIS — H3530 Unspecified macular degeneration: Secondary | ICD-10-CM

## 2014-09-10 DIAGNOSIS — H43819 Vitreous degeneration, unspecified eye: Secondary | ICD-10-CM

## 2014-09-10 DIAGNOSIS — H35039 Hypertensive retinopathy, unspecified eye: Secondary | ICD-10-CM

## 2014-09-10 DIAGNOSIS — H353290 Exudative age-related macular degeneration, unspecified eye, stage unspecified: Secondary | ICD-10-CM

## 2014-09-13 ENCOUNTER — Other Ambulatory Visit: Payer: Self-pay | Admitting: Gastroenterology

## 2014-09-17 ENCOUNTER — Other Ambulatory Visit: Payer: Self-pay | Admitting: Dermatology

## 2014-09-17 ENCOUNTER — Other Ambulatory Visit: Payer: Self-pay | Admitting: Gastroenterology

## 2014-10-04 ENCOUNTER — Ambulatory Visit (HOSPITAL_COMMUNITY)
Admission: RE | Admit: 2014-10-04 | Discharge: 2014-10-04 | Disposition: A | Discharge status: Home / Self Care | Payer: Medicare Other | Source: Ambulatory Visit | Attending: Gastroenterology | Admitting: Gastroenterology

## 2014-10-04 ENCOUNTER — Encounter (HOSPITAL_COMMUNITY)
Admission: RE | Disposition: A | Discharge status: Home / Self Care | Payer: Self-pay | Source: Ambulatory Visit | Attending: Gastroenterology

## 2014-10-04 ENCOUNTER — Encounter (HOSPITAL_COMMUNITY): Payer: Self-pay | Admitting: *Deleted

## 2014-10-04 DIAGNOSIS — K2270 Barrett's esophagus without dysplasia: Secondary | ICD-10-CM | POA: Diagnosis present

## 2014-10-04 DIAGNOSIS — C9000 Multiple myeloma not having achieved remission: Secondary | ICD-10-CM | POA: Diagnosis not present

## 2014-10-04 DIAGNOSIS — E538 Deficiency of other specified B group vitamins: Secondary | ICD-10-CM | POA: Insufficient documentation

## 2014-10-04 DIAGNOSIS — E039 Hypothyroidism, unspecified: Secondary | ICD-10-CM | POA: Insufficient documentation

## 2014-10-04 DIAGNOSIS — E780 Pure hypercholesterolemia: Secondary | ICD-10-CM | POA: Insufficient documentation

## 2014-10-04 DIAGNOSIS — I10 Essential (primary) hypertension: Secondary | ICD-10-CM | POA: Diagnosis not present

## 2014-10-04 DIAGNOSIS — H3530 Unspecified macular degeneration: Secondary | ICD-10-CM | POA: Diagnosis not present

## 2014-10-04 DIAGNOSIS — K219 Gastro-esophageal reflux disease without esophagitis: Secondary | ICD-10-CM | POA: Insufficient documentation

## 2014-10-04 DIAGNOSIS — K209 Esophagitis, unspecified: Secondary | ICD-10-CM | POA: Diagnosis not present

## 2014-10-04 DIAGNOSIS — G4733 Obstructive sleep apnea (adult) (pediatric): Secondary | ICD-10-CM | POA: Diagnosis not present

## 2014-10-04 HISTORY — PX: ESOPHAGOGASTRODUODENOSCOPY: SHX5428

## 2014-10-04 SURGERY — EGD (ESOPHAGOGASTRODUODENOSCOPY)
Anesthesia: Moderate Sedation

## 2014-10-04 MED ORDER — SODIUM CHLORIDE 0.9 % IV SOLN: INTRAVENOUS | Status: DC

## 2014-10-04 MED ORDER — FENTANYL CITRATE 0.05 MG/ML IJ SOLN
INTRAMUSCULAR | Status: AC
  Filled 2014-10-04: qty 2

## 2014-10-04 MED ORDER — BUTAMBEN-TETRACAINE-BENZOCAINE 2-2-14 % EX AERO
INHALATION_SPRAY | CUTANEOUS | Status: DC | PRN
  Administered 2014-10-04: 2 via TOPICAL

## 2014-10-04 MED ORDER — FENTANYL CITRATE 0.05 MG/ML IJ SOLN
INTRAMUSCULAR | Status: DC | PRN
  Administered 2014-10-04 (×2): 25 ug via INTRAVENOUS

## 2014-10-04 MED ORDER — MIDAZOLAM HCL 10 MG/2ML IJ SOLN
INTRAMUSCULAR | Status: DC | PRN
  Administered 2014-10-04 (×3): 2 mg via INTRAVENOUS

## 2014-10-04 MED ORDER — SODIUM CHLORIDE 0.9 % IV SOLN
INTRAVENOUS | Status: DC
  Administered 2014-10-04: 500 mL via INTRAVENOUS

## 2014-10-04 MED ORDER — MIDAZOLAM HCL 10 MG/2ML IJ SOLN
INTRAMUSCULAR | Status: AC
  Filled 2014-10-04: qty 2

## 2014-10-04 NOTE — H&P (Signed)
  Problem: Barrett's esophagus  History: The patient is a 80 year old male born [birth year]. He has uncomplicated Barrett's esophagus. He underwent surveillance esophagogastroduodenoscopy in June 2011; Barrett's mucosal biopsies did not show dysplasia. He chronically takes proton pump inhibitor therapy reports no heartburn, dysphagia, or odynophagia. Paragraph in the last 6 months, the patient has had 4 episodes of intense nausea  followed by epigastric discomfort and vomiting. The episodes are not associated with heartburn or dysphagia. The episodes usually resolve within 24 hours.  The patient is scheduled to undergo diagnostic esophagogastroduodenoscopy  Past medical history: Hypertension. Hypercholesterolemia.. Gastroesophageal reflux complicated by Barrett's esophagus. Hypothyroidism. Vitamin B12 deficiency. Multiple myeloma. Macular degeneration. Obstructive sleep apnea syndrome. Bone marrow biopsy. Tonsillectomy.  Exam: The patient is alert and lying comfortably on the endoscopy stretcher. Lungs are clear to auscultation. Abdomen is soft and nontender to palpation. Cardiac exam reveals a regular rhythm.  Plan: Proceed with diagnostic esophagogastroduodenoscopy

## 2014-10-04 NOTE — Discharge Instructions (Signed)
Esophagogastroduodenoscopy °Care After °Refer to this sheet in the next few weeks. These instructions provide you with information on caring for yourself after your procedure. Your caregiver may also give you more specific instructions. Your treatment has been planned according to current medical practices, but problems sometimes occur. Call your caregiver if you have any problems or questions after your procedure.  °HOME CARE INSTRUCTIONS °· Do not eat or drink anything until the numbing medicine (local anesthetic) has worn off and your gag reflex has returned. You will know that the local anesthetic has worn off when you can swallow comfortably. °· Do not drive for 12 hours after the procedure or as directed by your caregiver. °· Only take medicines as directed by your caregiver. °SEEK MEDICAL CARE IF:  °· You cannot stop coughing. °· You are not urinating at all or less than usual. °SEEK IMMEDIATE MEDICAL CARE IF: °· You have difficulty swallowing. °· You cannot eat or drink. °· You have worsening throat or chest pain. °· You have dizziness, lightheadedness, or you faint. °· You have nausea or vomiting. °· You have chills. °· You have a fever. °· You have severe abdominal pain. °· You have black, tarry, or bloody stools. °Document Released: 11/16/2012 Document Reviewed: 11/16/2012 °ExitCare® Patient Information ©2015 [employer]. This information is not intended to replace advice given to you by your health care provider. Make sure you discuss any questions you have with your health care provider. ° °

## 2014-10-04 NOTE — Op Note (Signed)
Problem: Barrett's esophagus  Endoscopist: Earle Gell  Procedure: Esophagogastroduodenoscopy with Barrett's mucosal biopsies  The patient was placed in the left lateral decubitus position. The Pentax gastroscope was passed through the posterior hypopharynx into the proximal esophagus without difficulty. The hypopharynx, larynx, and vocal cords appeared normal.  Esophagoscopy: The proximal, mid, and lower segments of the esophageal mucosa appeared normal. The squamocolumnar junction is slightly irregular in appearance and noted at 40 cm from the incisor teeth. Five biopsies were taken in a circumferential fashion from the esophagogastric junction to evaluate Barrett's mucosa.  Gastroscopy: Retroflex view of the gastric cardia and fundus was normal. The gastric body, antrum, and pylorus appeared normal  Duodenoscopy: The duodenal bulb and descending duodenum appeared normal.  Assessment: Short segment Barrett's esophagus. Otherwise normal esophagogastroduodenoscopy. Barrett's esophageal mucosal biopsies pending.

## 2014-10-05 ENCOUNTER — Encounter (HOSPITAL_COMMUNITY): Payer: Self-pay | Admitting: Gastroenterology

## 2014-10-15 ENCOUNTER — Encounter (INDEPENDENT_AMBULATORY_CARE_PROVIDER_SITE_OTHER): Payer: Medicare Other | Admitting: Ophthalmology

## 2014-10-15 DIAGNOSIS — H43813 Vitreous degeneration, bilateral: Secondary | ICD-10-CM

## 2014-10-15 DIAGNOSIS — H35033 Hypertensive retinopathy, bilateral: Secondary | ICD-10-CM

## 2014-10-15 DIAGNOSIS — H3531 Nonexudative age-related macular degeneration: Secondary | ICD-10-CM

## 2014-10-15 DIAGNOSIS — I10 Essential (primary) hypertension: Secondary | ICD-10-CM

## 2014-10-15 DIAGNOSIS — H3532 Exudative age-related macular degeneration: Secondary | ICD-10-CM

## 2014-11-19 ENCOUNTER — Encounter (INDEPENDENT_AMBULATORY_CARE_PROVIDER_SITE_OTHER): Payer: Medicare Other | Admitting: Ophthalmology

## 2014-11-19 ENCOUNTER — Other Ambulatory Visit: Payer: Self-pay | Admitting: Orthopedic Surgery

## 2014-11-19 DIAGNOSIS — H3532 Exudative age-related macular degeneration: Secondary | ICD-10-CM

## 2014-11-19 DIAGNOSIS — I10 Essential (primary) hypertension: Secondary | ICD-10-CM

## 2014-11-19 DIAGNOSIS — H35033 Hypertensive retinopathy, bilateral: Secondary | ICD-10-CM

## 2014-11-19 DIAGNOSIS — H43813 Vitreous degeneration, bilateral: Secondary | ICD-10-CM

## 2014-11-19 DIAGNOSIS — H3531 Nonexudative age-related macular degeneration: Secondary | ICD-10-CM

## 2014-12-12 ENCOUNTER — Encounter (HOSPITAL_BASED_OUTPATIENT_CLINIC_OR_DEPARTMENT_OTHER): Payer: Self-pay | Admitting: *Deleted

## 2014-12-12 NOTE — Progress Notes (Signed)
Having labs cancer center tomorrow-no new labs needed-he had lt hand done 5/15

## 2014-12-13 ENCOUNTER — Other Ambulatory Visit (HOSPITAL_COMMUNITY)
Admission: RE | Admit: 2014-12-13 | Discharge: 2014-12-13 | Disposition: A | Discharge status: Home / Self Care | Payer: Medicare Other | Source: Ambulatory Visit | Attending: Oncology | Admitting: Oncology

## 2014-12-13 ENCOUNTER — Other Ambulatory Visit (HOSPITAL_BASED_OUTPATIENT_CLINIC_OR_DEPARTMENT_OTHER): Payer: Medicare Other

## 2014-12-13 DIAGNOSIS — C9000 Multiple myeloma not having achieved remission: Secondary | ICD-10-CM

## 2014-12-13 DIAGNOSIS — D472 Monoclonal gammopathy: Secondary | ICD-10-CM

## 2014-12-13 DIAGNOSIS — D72820 Lymphocytosis (symptomatic): Secondary | ICD-10-CM | POA: Insufficient documentation

## 2014-12-13 DIAGNOSIS — D7589 Other specified diseases of blood and blood-forming organs: Secondary | ICD-10-CM | POA: Insufficient documentation

## 2014-12-13 LAB — BASIC METABOLIC PANEL (CC13)
Anion Gap: 8 mEq/L (ref 3–11)
BUN: 8.2 mg/dL (ref 7.0–26.0)
CHLORIDE: 99 meq/L (ref 98–109)
CO2: 27 mEq/L (ref 22–29)
Calcium: 8.4 mg/dL (ref 8.4–10.4)
Creatinine: 0.8 mg/dL (ref 0.7–1.3)
EGFR: 85 mL/min/{1.73_m2} — ABNORMAL LOW (ref >90)
Glucose: 113 mg/dl (ref 70–140)
POTASSIUM: 3.6 meq/L (ref 3.5–5.1)
Sodium: 134 mEq/L — ABNORMAL LOW (ref 136–145)

## 2014-12-13 LAB — CBC WITH DIFFERENTIAL/PLATELET
BASO%: 0.3 % (ref 0.0–2.0)
Basophils Absolute: 0 10*3/uL (ref 0.0–0.1)
EOS%: 3.9 % (ref 0.0–7.0)
Eosinophils Absolute: 0.2 10*3/uL (ref 0.0–0.5)
HCT: 33.8 % — ABNORMAL LOW (ref 38.4–49.9)
HGB: 11.7 g/dL — ABNORMAL LOW (ref 13.0–17.1)
LYMPH%: 53.6 % — ABNORMAL HIGH (ref 14.0–49.0)
MCH: 38.9 pg — ABNORMAL HIGH (ref 27.2–33.4)
MCHC: 34.6 g/dL (ref 32.0–36.0)
MCV: 112.4 fL — AB (ref 79.3–98.0)
MONO#: 0.6 10*3/uL (ref 0.1–0.9)
MONO%: 12 % (ref 0.0–14.0)
NEUT#: 1.5 10*3/uL (ref 1.5–6.5)
NEUT%: 30.2 % — ABNORMAL LOW (ref 39.0–75.0)
PLATELETS: 103 10*3/uL — AB (ref 140–400)
RBC: 3.01 10*6/uL — AB (ref 4.20–5.82)
RDW: 13.2 % (ref 11.0–14.6)
WBC: 5 10*3/uL (ref 4.0–10.3)
lymph#: 2.7 10*3/uL (ref 0.9–3.3)

## 2014-12-18 ENCOUNTER — Ambulatory Visit (HOSPITAL_BASED_OUTPATIENT_CLINIC_OR_DEPARTMENT_OTHER): Payer: Medicare Other | Admitting: Certified Registered"

## 2014-12-18 ENCOUNTER — Encounter (HOSPITAL_BASED_OUTPATIENT_CLINIC_OR_DEPARTMENT_OTHER): Payer: Self-pay | Admitting: Orthopedic Surgery

## 2014-12-18 ENCOUNTER — Ambulatory Visit (HOSPITAL_BASED_OUTPATIENT_CLINIC_OR_DEPARTMENT_OTHER)
Admission: RE | Admit: 2014-12-18 | Discharge: 2014-12-18 | Disposition: A | Discharge status: Home / Self Care | Payer: Medicare Other | Source: Ambulatory Visit | Attending: Orthopedic Surgery | Admitting: Orthopedic Surgery

## 2014-12-18 ENCOUNTER — Encounter (HOSPITAL_BASED_OUTPATIENT_CLINIC_OR_DEPARTMENT_OTHER)
Admission: RE | Disposition: A | Discharge status: Home / Self Care | Payer: Self-pay | Source: Ambulatory Visit | Attending: Orthopedic Surgery

## 2014-12-18 DIAGNOSIS — I10 Essential (primary) hypertension: Secondary | ICD-10-CM | POA: Insufficient documentation

## 2014-12-18 DIAGNOSIS — E538 Deficiency of other specified B group vitamins: Secondary | ICD-10-CM | POA: Diagnosis not present

## 2014-12-18 DIAGNOSIS — K219 Gastro-esophageal reflux disease without esophagitis: Secondary | ICD-10-CM | POA: Insufficient documentation

## 2014-12-18 DIAGNOSIS — E039 Hypothyroidism, unspecified: Secondary | ICD-10-CM | POA: Diagnosis not present

## 2014-12-18 DIAGNOSIS — M720 Palmar fascial fibromatosis [Dupuytren]: Secondary | ICD-10-CM | POA: Insufficient documentation

## 2014-12-18 DIAGNOSIS — H3530 Unspecified macular degeneration: Secondary | ICD-10-CM | POA: Diagnosis not present

## 2014-12-18 DIAGNOSIS — E785 Hyperlipidemia, unspecified: Secondary | ICD-10-CM | POA: Insufficient documentation

## 2014-12-18 DIAGNOSIS — Z87891 Personal history of nicotine dependence: Secondary | ICD-10-CM | POA: Insufficient documentation

## 2014-12-18 DIAGNOSIS — Z888 Allergy status to other drugs, medicaments and biological substances status: Secondary | ICD-10-CM | POA: Diagnosis not present

## 2014-12-18 HISTORY — PX: FASCIECTOMY: SHX6525

## 2014-12-18 HISTORY — PX: FASCIOTOMY: SHX132

## 2014-12-18 LAB — PROTEIN ELECTROPHORESIS, SERUM
ALPHA-1-GLOBULIN: 3.3 % (ref 2.9–4.9)
Albumin ELP: 40.8 % — ABNORMAL LOW (ref 55.8–66.1)
Alpha-2-Globulin: 7.1 % (ref 7.1–11.8)
BETA 2: 1.7 % — AB (ref 3.2–6.5)
Beta Globulin: 4.6 % — ABNORMAL LOW (ref 4.7–7.2)
Gamma Globulin: 42.5 % — ABNORMAL HIGH (ref 11.1–18.8)
M-Spike, %: 3.63 g/dL
TOTAL PROTEIN, SERUM ELECTROPHOR: 9.5 g/dL — AB (ref 6.0–8.3)

## 2014-12-18 LAB — KAPPA/LAMBDA LIGHT CHAINS
KAPPA LAMBDA RATIO: 5.3 — AB (ref 0.26–1.65)
Kappa free light chain: 5.46 mg/dL — ABNORMAL HIGH (ref 0.33–1.94)
LAMBDA FREE LGHT CHN: 1.03 mg/dL (ref 0.57–2.63)

## 2014-12-18 LAB — IGG: IgG (Immunoglobin G), Serum: 3800 mg/dL — ABNORMAL HIGH (ref 650–1600)

## 2014-12-18 SURGERY — FASCIOTOMY, UPPER EXTREMITY
Anesthesia: General | Site: Thumb | Laterality: Right

## 2014-12-18 MED ORDER — PROPOFOL 10 MG/ML IV EMUL
INTRAVENOUS | Status: AC
  Filled 2014-12-18: qty 50

## 2014-12-18 MED ORDER — DEXAMETHASONE SODIUM PHOSPHATE 10 MG/ML IJ SOLN
INTRAMUSCULAR | Status: DC | PRN
  Administered 2014-12-18: 10 mg via INTRAVENOUS

## 2014-12-18 MED ORDER — MIDAZOLAM HCL 2 MG/2ML IJ SOLN
1.0000 mg | INTRAMUSCULAR | Status: DC | PRN
  Administered 2014-12-18: 2 mg via INTRAVENOUS

## 2014-12-18 MED ORDER — FENTANYL CITRATE 0.05 MG/ML IJ SOLN
50.0000 ug | INTRAMUSCULAR | Status: DC | PRN
  Administered 2014-12-18: 100 ug via INTRAVENOUS

## 2014-12-18 MED ORDER — TRAMADOL HCL 50 MG PO TABS: 50.0000 mg | ORAL_TABLET | Freq: Four times a day (QID) | ORAL | Status: DC | PRN

## 2014-12-18 MED ORDER — ONDANSETRON HCL 4 MG/2ML IJ SOLN: 4.0000 mg | Freq: Once | INTRAMUSCULAR | Status: DC | PRN

## 2014-12-18 MED ORDER — BUPIVACAINE-EPINEPHRINE (PF) 0.5% -1:200000 IJ SOLN
INTRAMUSCULAR | Status: DC | PRN
  Administered 2014-12-18: 25 mL via PERINEURAL

## 2014-12-18 MED ORDER — PROPOFOL 10 MG/ML IV BOLUS
INTRAVENOUS | Status: DC | PRN
  Administered 2014-12-18: 200 mg via INTRAVENOUS

## 2014-12-18 MED ORDER — LACTATED RINGERS IV SOLN
INTRAVENOUS | Status: DC
  Administered 2014-12-18 (×2): via INTRAVENOUS

## 2014-12-18 MED ORDER — BUPIVACAINE HCL (PF) 0.25 % IJ SOLN
INTRAMUSCULAR | Status: AC
  Filled 2014-12-18: qty 30

## 2014-12-18 MED ORDER — CHLORHEXIDINE GLUCONATE 4 % EX LIQD: 60.0000 mL | Freq: Once | CUTANEOUS | Status: DC

## 2014-12-18 MED ORDER — THROMBIN 5000 UNITS EX SOLR
CUTANEOUS | Status: AC
  Filled 2014-12-18: qty 5000

## 2014-12-18 MED ORDER — FENTANYL CITRATE 0.05 MG/ML IJ SOLN
INTRAMUSCULAR | Status: AC
  Filled 2014-12-18: qty 2

## 2014-12-18 MED ORDER — CEFAZOLIN SODIUM-DEXTROSE 2-3 GM-% IV SOLR
2.0000 g | INTRAVENOUS | Status: AC
  Administered 2014-12-18: 2 g via INTRAVENOUS

## 2014-12-18 MED ORDER — ONDANSETRON HCL 4 MG/2ML IJ SOLN
INTRAMUSCULAR | Status: DC | PRN
  Administered 2014-12-18: 4 mg via INTRAVENOUS

## 2014-12-18 MED ORDER — LIDOCAINE HCL (CARDIAC) 20 MG/ML IV SOLN
INTRAVENOUS | Status: DC | PRN
  Administered 2014-12-18: 30 mg via INTRAVENOUS

## 2014-12-18 MED ORDER — MIDAZOLAM HCL 2 MG/2ML IJ SOLN
INTRAMUSCULAR | Status: AC
  Filled 2014-12-18: qty 2

## 2014-12-18 MED ORDER — THROMBIN 20000 UNITS EX KIT
PACK | CUTANEOUS | Status: DC | PRN
  Administered 2014-12-18: 5000 [IU] via TOPICAL

## 2014-12-18 MED ORDER — FENTANYL CITRATE 0.05 MG/ML IJ SOLN: 25.0000 ug | INTRAMUSCULAR | Status: DC | PRN

## 2014-12-18 MED ORDER — CEFAZOLIN SODIUM-DEXTROSE 2-3 GM-% IV SOLR: 2.0000 g | INTRAVENOUS | Status: DC

## 2014-12-18 MED ORDER — HYDROCODONE-ACETAMINOPHEN 5-325 MG PO TABS: 1.0000 | ORAL_TABLET | Freq: Four times a day (QID) | ORAL | Status: DC | PRN

## 2014-12-18 MED ORDER — BUPIVACAINE HCL (PF) 0.25 % IJ SOLN
INTRAMUSCULAR | Status: DC | PRN
  Administered 2014-12-18: 7 mL

## 2014-12-18 MED ORDER — FENTANYL CITRATE 0.05 MG/ML IJ SOLN
INTRAMUSCULAR | Status: DC | PRN
  Administered 2014-12-18 (×2): 25 ug via INTRAVENOUS
  Administered 2014-12-18: 50 ug via INTRAVENOUS

## 2014-12-18 MED ORDER — FENTANYL CITRATE 0.05 MG/ML IJ SOLN
INTRAMUSCULAR | Status: AC
  Filled 2014-12-18: qty 4

## 2014-12-18 MED ORDER — CEFAZOLIN SODIUM-DEXTROSE 2-3 GM-% IV SOLR
INTRAVENOUS | Status: AC
  Filled 2014-12-18: qty 50

## 2014-12-18 SURGICAL SUPPLY — 51 items
BLADE MINI RND TIP GREEN BEAV (BLADE) ×4 IMPLANT
BLADE SURG 15 STRL LF DISP TIS (BLADE) ×2 IMPLANT
BLADE SURG 15 STRL SS (BLADE) ×2
BNDG COHESIVE 3X5 TAN STRL LF (GAUZE/BANDAGES/DRESSINGS) ×4 IMPLANT
BNDG ESMARK 4X9 LF (GAUZE/BANDAGES/DRESSINGS) ×4 IMPLANT
BNDG GAUZE ELAST 4 BULKY (GAUZE/BANDAGES/DRESSINGS) ×4 IMPLANT
CHLORAPREP W/TINT 26ML (MISCELLANEOUS) ×4 IMPLANT
CORDS BIPOLAR (ELECTRODE) ×4 IMPLANT
COVER BACK TABLE 60X90IN (DRAPES) ×4 IMPLANT
COVER MAYO STAND STRL (DRAPES) ×4 IMPLANT
CUFF TOURNIQUET SINGLE 18IN (TOURNIQUET CUFF) ×4 IMPLANT
DECANTER SPIKE VIAL GLASS SM (MISCELLANEOUS) IMPLANT
DRAPE EXTREMITY T 121X128X90 (DRAPE) ×4 IMPLANT
DRAPE SURG 17X23 STRL (DRAPES) ×4 IMPLANT
DRSG KUZMA FLUFF (GAUZE/BANDAGES/DRESSINGS) IMPLANT
DRSG PAD ABDOMINAL 8X10 ST (GAUZE/BANDAGES/DRESSINGS) IMPLANT
GAUZE SPONGE 4X4 12PLY STRL (GAUZE/BANDAGES/DRESSINGS) ×4 IMPLANT
GAUZE XEROFORM 1X8 LF (GAUZE/BANDAGES/DRESSINGS) ×4 IMPLANT
GLOVE BIO SURGEON STRL SZ7.5 (GLOVE) ×4 IMPLANT
GLOVE BIOGEL PI IND STRL 6.5 (GLOVE) ×2 IMPLANT
GLOVE BIOGEL PI IND STRL 8 (GLOVE) ×2 IMPLANT
GLOVE BIOGEL PI IND STRL 8.5 (GLOVE) ×2 IMPLANT
GLOVE BIOGEL PI INDICATOR 6.5 (GLOVE) ×2
GLOVE BIOGEL PI INDICATOR 8 (GLOVE) ×2
GLOVE BIOGEL PI INDICATOR 8.5 (GLOVE) ×2
GLOVE ECLIPSE 6.5 STRL STRAW (GLOVE) ×4 IMPLANT
GLOVE EXAM NITRILE MD LF STRL (GLOVE) ×4 IMPLANT
GLOVE SURG ORTHO 8.0 STRL STRW (GLOVE) ×4 IMPLANT
GOWN STRL REUS W/ TWL LRG LVL3 (GOWN DISPOSABLE) ×2 IMPLANT
GOWN STRL REUS W/TWL LRG LVL3 (GOWN DISPOSABLE) ×2
GOWN STRL REUS W/TWL XL LVL3 (GOWN DISPOSABLE) ×8 IMPLANT
LOOP VESSEL MAXI BLUE (MISCELLANEOUS) ×4 IMPLANT
NEEDLE PRECISIONGLIDE 27X1.5 (NEEDLE) ×4 IMPLANT
NS IRRIG 1000ML POUR BTL (IV SOLUTION) ×4 IMPLANT
PACK BASIN DAY SURGERY FS (CUSTOM PROCEDURE TRAY) ×4 IMPLANT
PAD CAST 3X4 CTTN HI CHSV (CAST SUPPLIES) ×2 IMPLANT
PADDING CAST ABS 3INX4YD NS (CAST SUPPLIES)
PADDING CAST ABS 4INX4YD NS (CAST SUPPLIES) ×2
PADDING CAST ABS COTTON 3X4 (CAST SUPPLIES) IMPLANT
PADDING CAST ABS COTTON 4X4 ST (CAST SUPPLIES) ×2 IMPLANT
PADDING CAST COTTON 3X4 STRL (CAST SUPPLIES) ×2
SLEEVE SCD COMPRESS KNEE MED (MISCELLANEOUS) ×4 IMPLANT
SPLINT PLASTER CAST XFAST 3X15 (CAST SUPPLIES) ×10 IMPLANT
SPLINT PLASTER XTRA FASTSET 3X (CAST SUPPLIES) ×10
STOCKINETTE 4X48 STRL (DRAPES) ×4 IMPLANT
SUT ETHILON 4 0 PS 2 18 (SUTURE) ×4 IMPLANT
SUT SILK 2 0 FS (SUTURE) ×4 IMPLANT
SYR BULB 3OZ (MISCELLANEOUS) ×4 IMPLANT
SYR CONTROL 10ML LL (SYRINGE) ×4 IMPLANT
TOWEL OR 17X24 6PK STRL BLUE (TOWEL DISPOSABLE) ×8 IMPLANT
UNDERPAD 30X30 INCONTINENT (UNDERPADS AND DIAPERS) ×4 IMPLANT

## 2014-12-18 NOTE — H&P (Signed)
[name] is a 81 year old complaining of Dupuytren's contracture bilaterally, left greater than right. His left hand is a problem. He has had this injected with Xiaflex both thumb and small fingers by Dr. Caralyn Guile in 2011. He states it has recurred. He has no new history of injury. He states it is getting worse. His major complaint is the small finger which is flexed at both MP, PIP and DIP joints nearly into the palm of his hand. He has similar deformity on his left side with contracture to the PIP and DIP but full mobility to the MP of his small fingers. He also has first web space cords. There are no cords to the thumbs bilaterally. He is left hand dominant. He states this significant impairs his function. He states he has had contracture since the age of 57. He is of [place] descent. He does have these on his feet. There is a family history of diabetes. He is negative for it. He has has undergone fasciotomy, fasciectomy of his left hand.  He has significant cord going from his first web space right thumb, he shows a significant inability to abduct his thumb with only approximately 10 to 20 degrees of abduction on opposition with a very distinct cord.    PAST MEDICAL HISTORY: He has no known drug allergies. He is on Atenolol, Lasix, Levothyroxine, Protonix, simvastatin, and aspirin. He has not had any surgery.  FAMILY H ISTORY: Negative.  SOCIAL HISTORY: He does not smoke. He drinks daily. He is married and retired.  REVIEW OF SYSTEMS: Positive for glasses, ringing in his ears, high BP, otherwise negative for 14 points. [name] is an 81 y.o. male.   Chief Complaint: dupuytren's contracture right  HPI: see above  Past Medical History  Diagnosis Date  . Hypertension   . Monoclonal paraproteinemia   . Hyperlipemia   . GERD (gastroesophageal reflux disease)   . Irritable bowel syndrome (IBS)   . Thyroid disease     Hypothyroidism  . Dupuytren's contracture   . Vitamin B12  deficiency   . Macular degeneration   . Barrett's esophagus   . Elevated MCV     , B12 and normal b12  . Hypothyroidism   . Wears glasses     Past Surgical History  Procedure Laterality Date  . Skin cancer excision      Left ear  . Tonsillectomy      age 82 1/4  . Colonoscopy w/ polypectomy      at 81yo---benign  . Esophagogastroduodenoscopy    . Cataract extraction      both cataracts  . Fasciotomy Left 05/02/2014    Procedure: FASCIOTOMY LEFT THUMB 1ST WEB SMALL FINGER ;  Surgeon: Wynonia Sours, MD;  Location: [facility];  Service: Orthopedics;  Laterality: Left;  . Esophagogastroduodenoscopy N/A 10/04/2014    Procedure: ESOPHAGOGASTRODUODENOSCOPY (EGD);  Surgeon: Garlan Fair, MD;  Location: [facility];  Service: Endoscopy;  Laterality: N/A;    Family History  Problem Relation Age of Onset  . Lung cancer Father   . Lung cancer Brother    Social History:  reports that he quit smoking about 26 years ago. He does not have any smokeless tobacco history on file. He reports that he drinks alcohol. He reports that he does not use illicit drugs.  Allergies:  Allergies  Allergen Reactions  . Hydrochlorothiazide Other (See Comments)    hyponatremia    No prescriptions prior to admission  No results found for this or any previous visit (from the past 48 hour(s)).  No results found.   Pertinent items are noted in HPI.  Height 5\' 7"  (1.702 m), weight 81.194 kg (179 lb).  General appearance: alert, cooperative and appears stated age Head: Normocephalic, without obvious abnormality Neck: no JVD Resp: clear to auscultation bilaterally Cardio: regular rate and rhythm, S1, S2 normal, no murmur, click, rub or gallop GI: soft, non-tender; bowel sounds normal; no masses,  no organomegaly Extremities: contracture thumb and web right Pulses: 2+ and symmetric Skin: Skin color, texture, turgor normal. No rashes or lesions Neurologic: Grossly  normal Incision/Wound: na  Assessment/Plan Diagnosis: Dupuytren's contractures with degenerative arthritis.  We have discussed possibility of Dupuytren's fasciotomy of the right thumb with fasciectomy of the first web space, this may require some v-y or Z-plasty in an effort to improve the web space deepening.  He is desirous of proceeding.  He is aware there is no guarantee with the surgery, possibility of infection, recurrence, injury to arteries, nerves, tendons, incomplete relief of symptoms and dystrophy.   This will be scheduled as an outpatient under regional anesthesia.  Joffre Lucks R 12/18/2014, 3:52 AM

## 2014-12-18 NOTE — Anesthesia Postprocedure Evaluation (Signed)
  Anesthesia Post-op Note  Patient: [name]  Procedure(s) Performed: Procedure(s): FASCIOTOMY RIGHT THUMB (Right) FASCIECTOMY FIRST WEB SPACE RIGHT (Right)  Patient Location: PACU  Anesthesia Type: General with regional for post op pain   Level of Consciousness: awake, alert  and oriented  Airway and Oxygen Therapy: Patient Spontanous Breathing  Post-op Pain: none  Post-op Assessment: Post-op Vital signs reviewed  Post-op Vital Signs: Reviewed  Last Vitals:  Filed Vitals:   12/18/14 1100  BP: 130/55  Pulse: 61  Temp:   Resp: 16    Complications: No apparent anesthesia complications

## 2014-12-18 NOTE — Anesthesia Procedure Notes (Signed)
Anesthesia Regional Block:  Supraclavicular block  Pre-Anesthetic Checklist: ,, timeout performed, Correct Patient, Correct Site, Correct Laterality, Correct Procedure, Correct Position, site marked, Risks and benefits discussed,  Surgical consent,  Pre-op evaluation,  At surgeon's request and post-op pain management  Laterality: Right and Upper  Prep: chloraprep       Needles:  Injection technique: Single-shot  Needle Type: Echogenic Stimulator Needle     Needle Length: 5cm 5 cm Needle Gauge: 21 and 21 G    Additional Needles:  Procedures: ultrasound guided (picture in chart) Supraclavicular block Narrative:  Start time: 12/18/2014 8:49 AM End time: 12/18/2014 8:54 AM Injection made incrementally with aspirations every 5 mL.  Performed by: Personally  Anesthesiologist: Margate, Blythe

## 2014-12-18 NOTE — Progress Notes (Signed)
Assisted Dr. Crews with right, ultrasound guided, supraclavicular block. Side rails up, monitors on throughout procedure. See vital signs in flow sheet. Tolerated Procedure well. 

## 2014-12-18 NOTE — Anesthesia Preprocedure Evaluation (Addendum)
Anesthesia Evaluation  Patient identified by MRN, date of birth, ID band Patient awake    Reviewed: Allergy & Precautions, NPO status , Patient's Chart, lab work & pertinent test results, reviewed documented beta blocker date and time   Airway Mallampati: I  TM Distance: >3 FB Neck ROM: Full    Dental  (+) Teeth Intact, Dental Advisory Given   Pulmonary former smoker,  breath sounds clear to auscultation        Cardiovascular hypertension, Pt. on medications and Pt. on home beta blockers Rhythm:Regular Rate:Normal     Neuro/Psych    GI/Hepatic GERD-  Medicated and Controlled,  Endo/Other  Hypothyroidism   Renal/GU      Musculoskeletal   Abdominal   Peds  Hematology   Anesthesia Other Findings   Reproductive/Obstetrics                            Anesthesia Physical Anesthesia Plan  ASA: II  Anesthesia Plan: General   Post-op Pain Management:    Induction: Intravenous  Airway Management Planned: LMA  Additional Equipment:   Intra-op Plan:   Post-operative Plan: Extubation in OR  Informed Consent: I have reviewed the patients History and Physical, chart, labs and discussed the procedure including the risks, benefits and alternatives for the proposed anesthesia with the patient or authorized representative who has indicated his/her understanding and acceptance.   Dental advisory given  Plan Discussed with: CRNA, Anesthesiologist and Surgeon  Anesthesia Plan Comments:         Anesthesia Quick Evaluation

## 2014-12-18 NOTE — Discharge Instructions (Addendum)
[facility] Instructions °Hand Surgery ° °Wound Care: °Keep your hand elevated above the level of your heart.  Do not allow it to dangle by your side.  Keep the dressing dry and do not remove it unless your doctor advises you to do so.  He will usually change it at the time of your post-op visit.  Moving your fingers is advised to stimulate circulation but will depend on the site of your surgery.  If you have a splint applied, your doctor will advise you regarding movement. ° °Activity: °Do not drive or operate machinery today.  Rest today and then you may return to your normal activity and work as indicated by your physician. ° °Diet:  °Drink liquids today or eat a light diet.  You may resume a regular diet tomorrow.   ° °General expectations: °Pain for two to three days. °Fingers may become slightly swollen. ° °Call your doctor if any of the following occur: °Severe pain not relieved by pain medication. °Elevated temperature. °Dressing soaked with blood. °Inability to move fingers. °White or bluish color to fingers. ° ° °Regional Anesthesia Blocks ° °1. Numbness or the inability to move the "blocked" extremity may last from 3-48 hours after placement. The length of time depends on the medication injected and your individual response to the medication. If the numbness is not going away after 48 hours, call your surgeon. ° °2. The extremity that is blocked will need to be protected until the numbness is gone and the  Strength has returned. Because you cannot feel it, you will need to take extra care to avoid injury. Because it may be weak, you may have difficulty moving it or using it. You may not know what position it is in without looking at it while the block is in effect. ° °3. For blocks in the legs and feet, returning to weight bearing and walking needs to be done carefully. You will need to wait until the numbness is entirely gone and the strength has returned. You should be able to move your leg and foot  normally before you try and bear weight or walk. You will need someone to be with you when you first try to ensure you do not fall and possibly risk injury. ° °4. Bruising and tenderness at the needle site are common side effects and will resolve in a few days. ° °5. Persistent numbness or new problems with movement should be communicated to the surgeon or the [facility] ([phone])/ [facility] ([phone]). ° ° °Post Anesthesia Home Care Instructions ° °Activity: °Get plenty of rest for the remainder of the day. A responsible adult should stay with you for 24 hours following the procedure.  °For the next 24 hours, DO NOT: °-Drive a car °-Operate machinery °-Drink alcoholic beverages °-Take any medication unless instructed by your physician °-Make any legal decisions or sign important papers. ° °Meals: °Start with liquid foods such as gelatin or soup. Progress to regular foods as tolerated. Avoid greasy, spicy, heavy foods. If nausea and/or vomiting occur, drink only clear liquids until the nausea and/or vomiting subsides. Call your physician if vomiting continues. ° °Special Instructions/Symptoms: °Your throat may feel dry or sore from the anesthesia or the breathing tube placed in your throat during surgery. If this causes discomfort, gargle with warm salt water. The discomfort should disappear within 24 hours. ° °

## 2014-12-18 NOTE — Transfer of Care (Signed)
Immediate Anesthesia Transfer of Care Note  Patient: [name]  Procedure(s) Performed: Procedure(s): FASCIOTOMY RIGHT THUMB (Right) FASCIECTOMY FIRST WEB SPACE RIGHT (Right)  Patient Location: PACU  Anesthesia Type:GA combined with regional for post-op pain  Level of Consciousness: awake, alert , oriented and patient cooperative  Airway & Oxygen Therapy: Patient Spontanous Breathing and Patient connected to face mask oxygen  Post-op Assessment: Report given to PACU RN and Post -op Vital signs reviewed and stable  Post vital signs: Reviewed and stable  Complications: No apparent anesthesia complications

## 2014-12-18 NOTE — Op Note (Signed)
Dictation Number [id number]

## 2014-12-18 NOTE — Brief Op Note (Signed)
12/18/2014  10:32 AM  PATIENT:  Kathi Ludwig  81 y.o. male  PRE-OPERATIVE DIAGNOSIS:  DUPUYTREN'S RIGHT THUMB 1ST WEB SPACE  POST-OPERATIVE DIAGNOSIS:  DUPUYTREN'S RIGHT THUMB 1ST WEB SPACE  PROCEDURE:  Procedure(s): FASCIOTOMY RIGHT THUMB (Right) FASCIECTOMY FIRST WEB SPACE RIGHT (Right)  SURGEON:  Surgeon(s) and Role:    * Daryll Brod, MD - Primary  PHYSICIAN ASSISTANT:   ASSISTANTS: K Tylisha Danis,MD    ANESTHESIA:   local, regional and general  EBL:  Total I/O In: 1000 [I.V.:1000] Out: -   BLOOD ADMINISTERED:none  DRAINS: none   LOCAL MEDICATIONS USED:  BUPIVICAINE   SPECIMEN:  No Specimen  DISPOSITION OF SPECIMEN:  N/A  COUNTS:  YES  TOURNIQUET:   Total Tourniquet Time Documented: Upper Arm (Right) - 39 minutes Total: Upper Arm (Right) - 39 minutes   DICTATION: .Other Dictation: Dictation Number [id number]  PLAN OF CARE: Discharge to home after PACU  PATIENT DISPOSITION:  PACU - hemodynamically stable.

## 2014-12-19 ENCOUNTER — Encounter (HOSPITAL_BASED_OUTPATIENT_CLINIC_OR_DEPARTMENT_OTHER): Payer: Self-pay | Admitting: Orthopedic Surgery

## 2014-12-19 LAB — FLOW CYTOMETRY

## 2014-12-19 NOTE — Op Note (Signed)
[name], [name] NO.:  [id number]  MEDICAL RECORD NO.:  [id number]  LOCATION:                                 FACILITY:  PHYSICIAN:  Daryll Brod, M.D.       DATE OF BIRTH:  01/25/1934  DATE OF PROCEDURE:  12/18/2014 DATE OF DISCHARGE:  12/18/2014                              OPERATIVE REPORT   PREOPERATIVE DIAGNOSIS:  Dupuytren's contracture, right thumb, right first webspace.  POSTOPERATIVE DIAGNOSIS:  Dupuytren's contracture, right thumb, right first webspace.  OPERATION:  Fasciotomy, fasciectomy, right thumb and first webspace with V-Y advancements, right thumb.  SURGEON:  Daryll Brod, M.D.  ASSISTANT:  Leanora Cover, MD  ANESTHESIA:  General, supraclavicular block, local.  ANESTHESIOLOGIST:  Lorrene Reid, M.D.  HISTORY:  The patient is a 81 year old male with a history of Dupuytren's contracture, bilateral hands.  He has undergone surgical treatment of his left side.  He has a contracture of the first webspace and thumb on his right side, he is desirous of proceeding to have that dealt.  He has significant inability to abduct his thumb and oppose his thumb.  He is desirous of increasing the ability to open his first webspace.  Pre, peri, and postoperative course have been discussed along with risks and complications.  He is aware that there is no guarantee with the surgery; possibility of infection; recurrence of injury to arteries, nerves, tendons; incomplete release of symptoms and dystrophy. In the preoperative area, the patient is seen, the extremity marked by both patient and surgeon, and antibiotic given.  PROCEDURE IN DETAIL:  A supraclavicular block was carried out in the preoperative area, was brought to the operating room, had continued feeling, a general anesthetic was given.  He was prepped using ChloraPrep, supine position, right arm free.  A 3-minute dry time was allowed.  Time-out taken, confirming the patient and procedure.   The limb was exsanguinated with an Esmarch bandage.  Tourniquet placed in the upper arm was inflated to 250 mmHg.  The cord to the thumb was attended to first, transverse incisions were made over the thenar eminence just proximal to the metacarpophalangeal joint primarily on the radial aspect.  The large very thick cord was immediately encountered with blunt and sharp dissection, and this was dissected free. Retractors were placed allowing visualization of the digital nerves, arteries beneath, this was then transected at the mid-thenar eminence and then distally through two transverse incisions, this allowed the metacarpophalangeal joint and IP joint to come fully straight.  It was decided not to proceed with a fasciotomy of the portion of the cord on the proximal phalanx.  An oblique incision was then made over the first webspace carried down through the subcutaneous tissue.  Neurovascular structure was again identified, a very thick tenuous cord was then transected, this allowed opening of the first webspace.  These were then created in the first webspace and the cord followed radially and ulnarly over to the base of the metacarpal of the thumb, base of the proximal phalanx of the thumb, base of the proximal phalanx of the index finger with protection of neurovascular bundles on each digit.  The cord was excised.  The  wound was then copiously irrigated with saline, these were then converted to wise.  The thrombin was then injected, sprayed onto the first webspace area and the two flaps were then extended dorsally and palmarly to increase the webspace and sutured into position with interrupted 5-0 nylon sutures.  One of the transverse incisions on the thenar eminence was able to be closed, this was done with an interrupted 5-0 nylon suture.  The second wound was unable to be closed, will be left open, and should granulate and heal with local care.  Sterile compressive dressing and  webspace splint was applied.  On deflation of the tourniquet, all fingers were immediately pinked.  Prior to closure, the two wounds were injected with 0.25% bupivacaine without epinephrine, approximately 9 mL was used.  On deflation of the tourniquet, all fingers were immediately pinked.  He was taken to the recovery room for observation in satisfactory condition.  He will be discharged to home to return to the [facility] in 1 week, on hydrocodone.          ______________________________ Daryll Brod, M.D.     GK/MEDQ  D:  12/18/2014  T:  12/18/2014  Job:  [id number]

## 2014-12-20 ENCOUNTER — Ambulatory Visit (HOSPITAL_BASED_OUTPATIENT_CLINIC_OR_DEPARTMENT_OTHER): Payer: Medicare Other | Admitting: Oncology

## 2014-12-20 ENCOUNTER — Telehealth: Payer: Self-pay | Admitting: Oncology

## 2014-12-20 VITALS — BP 135/56 | HR 63 | Temp 97.9°F | Resp 18 | Ht 67.0 in | Wt 191.7 lb

## 2014-12-20 DIAGNOSIS — C9000 Multiple myeloma not having achieved remission: Secondary | ICD-10-CM

## 2014-12-20 DIAGNOSIS — G629 Polyneuropathy, unspecified: Secondary | ICD-10-CM

## 2014-12-20 DIAGNOSIS — D696 Thrombocytopenia, unspecified: Secondary | ICD-10-CM

## 2014-12-20 DIAGNOSIS — D7589 Other specified diseases of blood and blood-forming organs: Secondary | ICD-10-CM

## 2014-12-20 NOTE — Telephone Encounter (Signed)
Gave avs & cal for May. °

## 2014-12-20 NOTE — Progress Notes (Signed)
  [facility] OFFICE PROGRESS NOTE   Diagnosis: Multiple myeloma  INTERVAL HISTORY:   He returns as scheduled. He feels well. He reports occasional sharp pain in the extremities lasting for seconds. No consistent pain. He recently had a basal cell carcinoma removed from the nose. He underwent Dupuytren's contracture surgery at the right thumb on 12/18/2014. No recent infection. No other complaint.  Objective:  Vital signs in last 24 hours:  Blood pressure 135/56, pulse 63, temperature 97.9 F (36.6 C), temperature source Oral, resp. rate 18, height $RemoveBe'5\' 7"'IMIEYgbIN$  (1.702 m), weight 191 lb 11.2 oz (86.955 kg), SpO2 95 %.    Resp: Lungs clear bilaterally Cardio: Regular rate and rhythm GI: No hepatomegaly Vascular: Trace edema at the left lower leg      Lab Results:  Lab Results  Component Value Date   WBC 5.0 12/13/2014   HGB 11.7* 12/13/2014   HCT 33.8* 12/13/2014   MCV 112.4* 12/13/2014   PLT 103* 12/13/2014   NEUTROABS 1.5 12/13/2014   creatinine 0.8, calcium 8.4 Serum M spike 3.63, IgG 3800, kappa free light chains 5.46   Medications: I have reviewed the patient's current medications.  Assessment/plan:  1.Multiple myeloma   Elevated IgG level with a monoclonal protein.. The monoclonal gammopathy dates to 2008   Bone marrow biopsy 05/23/2014 consistent with multiple myeloma, 59% plasma cells-kappa light chain restricted  Negative bone survey 05/31/2014 2. Red cell macrocytosis  3. Moderate alcohol use  4. "Neuropathy "of the feet -he describes "burning" of the feet for the past 20 years  5. Mild lymphocytosis -negative peripheral blood flow cytometry 12/27/2013 6. Mild Thrombocytopenia secondary to multiple myeloma   Disposition:  [name] appears to have smoldering multiple myeloma. He appears asymptomatic from the myeloma and there is no absolute indication for treatment at present. We decided to continue observation for now, though he  understands he will likely need treatment within the next one to 2 years. We discussed Revlimid/Decadron therapy and he was given reading materials on Revlimid today.  [name] will return for an office visit, repeat myeloma panel, and bone survey in 4 months.  [name], MD  12/20/2014  5:34 PM

## 2014-12-24 ENCOUNTER — Encounter (INDEPENDENT_AMBULATORY_CARE_PROVIDER_SITE_OTHER): Payer: Medicare Other | Admitting: Ophthalmology

## 2014-12-24 DIAGNOSIS — H35033 Hypertensive retinopathy, bilateral: Secondary | ICD-10-CM

## 2014-12-24 DIAGNOSIS — H3532 Exudative age-related macular degeneration: Secondary | ICD-10-CM

## 2014-12-24 DIAGNOSIS — H43813 Vitreous degeneration, bilateral: Secondary | ICD-10-CM

## 2014-12-24 DIAGNOSIS — I10 Essential (primary) hypertension: Secondary | ICD-10-CM

## 2014-12-24 DIAGNOSIS — H3531 Nonexudative age-related macular degeneration: Secondary | ICD-10-CM

## 2015-01-24 ENCOUNTER — Encounter (INDEPENDENT_AMBULATORY_CARE_PROVIDER_SITE_OTHER): Payer: Medicare Other | Admitting: Ophthalmology

## 2015-01-24 DIAGNOSIS — H3532 Exudative age-related macular degeneration: Secondary | ICD-10-CM

## 2015-01-24 DIAGNOSIS — I10 Essential (primary) hypertension: Secondary | ICD-10-CM

## 2015-01-24 DIAGNOSIS — H35033 Hypertensive retinopathy, bilateral: Secondary | ICD-10-CM | POA: Diagnosis not present

## 2015-01-24 DIAGNOSIS — H3531 Nonexudative age-related macular degeneration: Secondary | ICD-10-CM

## 2015-01-28 ENCOUNTER — Encounter (INDEPENDENT_AMBULATORY_CARE_PROVIDER_SITE_OTHER): Payer: Medicare Other | Admitting: Ophthalmology

## 2015-03-04 ENCOUNTER — Encounter (INDEPENDENT_AMBULATORY_CARE_PROVIDER_SITE_OTHER): Payer: Medicare Other | Admitting: Ophthalmology

## 2015-03-04 DIAGNOSIS — I10 Essential (primary) hypertension: Secondary | ICD-10-CM | POA: Diagnosis not present

## 2015-03-04 DIAGNOSIS — H35033 Hypertensive retinopathy, bilateral: Secondary | ICD-10-CM

## 2015-03-04 DIAGNOSIS — H3532 Exudative age-related macular degeneration: Secondary | ICD-10-CM

## 2015-03-04 DIAGNOSIS — H3531 Nonexudative age-related macular degeneration: Secondary | ICD-10-CM | POA: Diagnosis not present

## 2015-03-04 DIAGNOSIS — H43813 Vitreous degeneration, bilateral: Secondary | ICD-10-CM | POA: Diagnosis not present

## 2015-04-09 ENCOUNTER — Encounter (INDEPENDENT_AMBULATORY_CARE_PROVIDER_SITE_OTHER): Payer: Medicare Other | Admitting: Ophthalmology

## 2015-04-09 DIAGNOSIS — H3532 Exudative age-related macular degeneration: Secondary | ICD-10-CM

## 2015-04-09 DIAGNOSIS — I10 Essential (primary) hypertension: Secondary | ICD-10-CM

## 2015-04-09 DIAGNOSIS — H43813 Vitreous degeneration, bilateral: Secondary | ICD-10-CM

## 2015-04-09 DIAGNOSIS — H35033 Hypertensive retinopathy, bilateral: Secondary | ICD-10-CM

## 2015-04-09 DIAGNOSIS — H3531 Nonexudative age-related macular degeneration: Secondary | ICD-10-CM

## 2015-04-19 ENCOUNTER — Other Ambulatory Visit (HOSPITAL_BASED_OUTPATIENT_CLINIC_OR_DEPARTMENT_OTHER): Payer: Medicare Other

## 2015-04-19 DIAGNOSIS — C9000 Multiple myeloma not having achieved remission: Secondary | ICD-10-CM | POA: Diagnosis present

## 2015-04-19 LAB — CBC WITH DIFFERENTIAL/PLATELET
BASO%: 0.2 % (ref 0.0–2.0)
Basophils Absolute: 0 10*3/uL (ref 0.0–0.1)
EOS%: 5.1 % (ref 0.0–7.0)
Eosinophils Absolute: 0.2 10*3/uL (ref 0.0–0.5)
HCT: 29.9 % — ABNORMAL LOW (ref 38.4–49.9)
HGB: 10.7 g/dL — ABNORMAL LOW (ref 13.0–17.1)
LYMPH%: 53.2 % — ABNORMAL HIGH (ref 14.0–49.0)
MCH: 39.8 pg — ABNORMAL HIGH (ref 27.2–33.4)
MCHC: 35.8 g/dL (ref 32.0–36.0)
MCV: 111.2 fL — AB (ref 79.3–98.0)
MONO#: 0.6 10*3/uL (ref 0.1–0.9)
MONO%: 13.5 % (ref 0.0–14.0)
NEUT%: 28 % — ABNORMAL LOW (ref 39.0–75.0)
NEUTROS ABS: 1.3 10*3/uL — AB (ref 1.5–6.5)
PLATELETS: 85 10*3/uL — AB (ref 140–400)
RBC: 2.69 10*6/uL — ABNORMAL LOW (ref 4.20–5.82)
RDW: 13.7 % (ref 11.0–14.6)
WBC: 4.5 10*3/uL (ref 4.0–10.3)
lymph#: 2.4 10*3/uL (ref 0.9–3.3)

## 2015-04-19 LAB — BASIC METABOLIC PANEL (CC13)
Anion Gap: 11 mEq/L (ref 3–11)
BUN: 10.4 mg/dL (ref 7.0–26.0)
CALCIUM: 8.3 mg/dL — AB (ref 8.4–10.4)
CO2: 22 mEq/L (ref 22–29)
Chloride: 99 mEq/L (ref 98–109)
Creatinine: 0.8 mg/dL (ref 0.7–1.3)
EGFR: 85 mL/min/{1.73_m2} — ABNORMAL LOW (ref >90)
Glucose: 107 mg/dl (ref 70–140)
Potassium: 4.2 mEq/L (ref 3.5–5.1)
Sodium: 132 mEq/L — ABNORMAL LOW (ref 136–145)

## 2015-04-23 LAB — PROTEIN ELECTROPHORESIS, SERUM
ALBUMIN ELP: 3.3 g/dL — AB (ref 3.8–4.8)
Abnormal Protein Band1: 3.1 g/dL
Alpha-1-Globulin: 0.2 g/dL (ref 0.2–0.3)
Alpha-2-Globulin: 0.6 g/dL (ref 0.5–0.9)
BETA 2: 0.1 g/dL — AB (ref 0.2–0.5)
BETA GLOBULIN: 0.4 g/dL (ref 0.4–0.6)
Gamma Globulin: 3.6 g/dL — ABNORMAL HIGH (ref 0.8–1.7)
Total Protein, Serum Electrophoresis: 8.2 g/dL — ABNORMAL HIGH (ref 6.1–8.1)

## 2015-04-23 LAB — KAPPA/LAMBDA LIGHT CHAINS
KAPPA FREE LGHT CHN: 9.93 mg/dL — AB (ref 0.33–1.94)
KAPPA LAMBDA RATIO: 9.28 — AB (ref 0.26–1.65)
LAMBDA FREE LGHT CHN: 1.07 mg/dL (ref 0.57–2.63)

## 2015-04-23 LAB — IGG: IgG (Immunoglobin G), Serum: 5440 mg/dL — ABNORMAL HIGH (ref 650–1600)

## 2015-04-26 ENCOUNTER — Ambulatory Visit (HOSPITAL_BASED_OUTPATIENT_CLINIC_OR_DEPARTMENT_OTHER): Payer: Medicare Other | Admitting: Oncology

## 2015-04-26 ENCOUNTER — Ambulatory Visit: Payer: Medicare Other | Admitting: Oncology

## 2015-04-26 ENCOUNTER — Ambulatory Visit (HOSPITAL_COMMUNITY)
Admission: RE | Admit: 2015-04-26 | Discharge: 2015-04-26 | Disposition: A | Discharge status: Home / Self Care | Payer: Medicare Other | Source: Ambulatory Visit | Attending: Oncology | Admitting: Oncology

## 2015-04-26 VITALS — BP 143/51 | HR 72 | Temp 98.3°F | Resp 18 | Ht 67.0 in | Wt 182.7 lb

## 2015-04-26 DIAGNOSIS — D6959 Other secondary thrombocytopenia: Secondary | ICD-10-CM | POA: Diagnosis not present

## 2015-04-26 DIAGNOSIS — C9000 Multiple myeloma not having achieved remission: Secondary | ICD-10-CM | POA: Diagnosis present

## 2015-04-26 DIAGNOSIS — D6481 Anemia due to antineoplastic chemotherapy: Secondary | ICD-10-CM

## 2015-04-26 DIAGNOSIS — D7589 Other specified diseases of blood and blood-forming organs: Secondary | ICD-10-CM

## 2015-04-26 DIAGNOSIS — D472 Monoclonal gammopathy: Secondary | ICD-10-CM

## 2015-04-26 NOTE — Progress Notes (Signed)
  [facility] OFFICE PROGRESS NOTE   Diagnosis: Multiple myeloma  INTERVAL HISTORY:   Mr. Ellender Hose returns as scheduled. He has noted increased malaise. He has intermittent sharp discomfort lasting for seconds at the left pretibial area and distal right arm. No recent infection.  Objective:  Vital signs in last 24 hours:  Blood pressure 143/51, pulse 72, temperature 98.3 F (36.8 C), temperature source Oral, resp. rate 18, height $RemoveBe'5\' 7"'DvQylGPxp$  (1.702 m), weight 182 lb 11.2 oz (82.872 kg), SpO2 97 %.   Lymphatics: No cervical, supraclavicular, or axillary nodes Resp: Lungs clear bilaterally Cardio: Regular rate and rhythm GI: Smooth liver edge? At the right lateral abdomen, no splenomegaly Vascular: The left lower leg is slightly larger than the right side with chronic stasis change Musculoskeletal: No tenderness or mass at the left lower leg      Lab Results:  Lab Results  Component Value Date   WBC 4.5 04/19/2015   HGB 10.7* 04/19/2015   HCT 29.9* 04/19/2015   MCV 111.2* 04/19/2015   PLT 85* 04/19/2015   NEUTROABS 1.3* 04/19/2015   creatinine 0.8, calcium 8.3, IgG 5440, serum M spike  3.1   Medications: I have reviewed the patient's current medications.  Assessment/Plan: 1.Multiple myeloma   Elevated IgG level with a monoclonal protein.. The monoclonal gammopathy dates to 2008   Bone marrow biopsy 05/23/2014 consistent with multiple myeloma, 59% plasma cells-kappa light chain restricted  Negative bone survey 05/31/2014 2. Red cell macrocytosis  3. Moderate alcohol use  4. "Neuropathy "of the feet -he describes "burning" of the feet for the past 20 years  5. Mild lymphocytosis -negative peripheral blood flow cytometry 12/27/2013 6. Mild Thrombocytopenia secondary to multiple myeloma  7. Anemia secondary to multiple myeloma    Disposition:  Mr. Shreve has indolent multiple myeloma. The IgG is higher and he has developed progressive  anemia/thrombocytopenia. The serum M spike was lower on 04/19/2015. The overall clinical picture is consistent with progression of multiple myeloma. He will have a metastatic bone survey today.  I discussed treatment options with Mr. Tabbert and his wife. We discussed Revlimid/Decadron and Velcade/Decadron. He is more comfortable with Revlimid/Decadron. I reviewed the potential toxicities associated with this regimen including the chance for nausea, diarrhea, neuropathy, hematologic toxicity, and venous thromboembolic disease.  We decided to repeat a CBC and serum protein electrophoresis one month from the last labs. If there is continued evidence for progressive myeloma the plan is to begin Revlimid/Decadron. He will return for a lab visit 05/16/2015 and an office visit 05/20/2015.  [name], MD  04/26/2015  10:36 AM

## 2015-05-07 ENCOUNTER — Encounter (INDEPENDENT_AMBULATORY_CARE_PROVIDER_SITE_OTHER): Payer: Medicare Other | Admitting: Ophthalmology

## 2015-05-07 DIAGNOSIS — H35033 Hypertensive retinopathy, bilateral: Secondary | ICD-10-CM | POA: Diagnosis not present

## 2015-05-07 DIAGNOSIS — H3532 Exudative age-related macular degeneration: Secondary | ICD-10-CM

## 2015-05-07 DIAGNOSIS — H26491 Other secondary cataract, right eye: Secondary | ICD-10-CM

## 2015-05-07 DIAGNOSIS — H3531 Nonexudative age-related macular degeneration: Secondary | ICD-10-CM

## 2015-05-07 DIAGNOSIS — I10 Essential (primary) hypertension: Secondary | ICD-10-CM | POA: Diagnosis not present

## 2015-05-07 DIAGNOSIS — H43813 Vitreous degeneration, bilateral: Secondary | ICD-10-CM

## 2015-05-15 ENCOUNTER — Other Ambulatory Visit (INDEPENDENT_AMBULATORY_CARE_PROVIDER_SITE_OTHER): Payer: Medicare Other | Admitting: Ophthalmology

## 2015-05-15 DIAGNOSIS — H26491 Other secondary cataract, right eye: Secondary | ICD-10-CM

## 2015-05-16 ENCOUNTER — Other Ambulatory Visit (HOSPITAL_BASED_OUTPATIENT_CLINIC_OR_DEPARTMENT_OTHER): Payer: Medicare Other

## 2015-05-16 DIAGNOSIS — C9000 Multiple myeloma not having achieved remission: Secondary | ICD-10-CM | POA: Diagnosis present

## 2015-05-16 DIAGNOSIS — D472 Monoclonal gammopathy: Secondary | ICD-10-CM

## 2015-05-16 LAB — CBC WITH DIFFERENTIAL/PLATELET
BASO%: 0.4 % (ref 0.0–2.0)
Basophils Absolute: 0 10*3/uL (ref 0.0–0.1)
EOS%: 4 % (ref 0.0–7.0)
Eosinophils Absolute: 0.2 10*3/uL (ref 0.0–0.5)
HCT: 29.7 % — ABNORMAL LOW (ref 38.4–49.9)
HGB: 10.5 g/dL — ABNORMAL LOW (ref 13.0–17.1)
LYMPH#: 3.3 10*3/uL (ref 0.9–3.3)
LYMPH%: 61.7 % — ABNORMAL HIGH (ref 14.0–49.0)
MCH: 40.2 pg — ABNORMAL HIGH (ref 27.2–33.4)
MCHC: 35.2 g/dL (ref 32.0–36.0)
MCV: 114.2 fL — ABNORMAL HIGH (ref 79.3–98.0)
MONO#: 0.5 10*3/uL (ref 0.1–0.9)
MONO%: 9.2 % (ref 0.0–14.0)
NEUT#: 1.3 10*3/uL — ABNORMAL LOW (ref 1.5–6.5)
NEUT%: 24.7 % — ABNORMAL LOW (ref 39.0–75.0)
PLATELETS: 89 10*3/uL — AB (ref 140–400)
RBC: 2.6 10*6/uL — ABNORMAL LOW (ref 4.20–5.82)
RDW: 13.3 % (ref 11.0–14.6)
WBC: 5.4 10*3/uL (ref 4.0–10.3)

## 2015-05-16 LAB — COMPREHENSIVE METABOLIC PANEL (CC13)
ALT: 23 U/L (ref 0–55)
ANION GAP: 5 meq/L (ref 3–11)
AST: 61 U/L — ABNORMAL HIGH (ref 5–34)
Albumin: 3 g/dL — ABNORMAL LOW (ref 3.5–5.0)
Alkaline Phosphatase: 57 U/L (ref 40–150)
BUN: 8.1 mg/dL (ref 7.0–26.0)
CALCIUM: 8.3 mg/dL — AB (ref 8.4–10.4)
CHLORIDE: 103 meq/L (ref 98–109)
CO2: 24 meq/L (ref 22–29)
Creatinine: 0.8 mg/dL (ref 0.7–1.3)
EGFR: 85 mL/min/{1.73_m2} — ABNORMAL LOW (ref >90)
GLUCOSE: 131 mg/dL (ref 70–140)
POTASSIUM: 4 meq/L (ref 3.5–5.1)
SODIUM: 132 meq/L — AB (ref 136–145)
Total Bilirubin: 0.9 mg/dL (ref 0.20–1.20)
Total Protein: 9.6 g/dL — ABNORMAL HIGH (ref 6.4–8.3)

## 2015-05-20 ENCOUNTER — Ambulatory Visit (HOSPITAL_BASED_OUTPATIENT_CLINIC_OR_DEPARTMENT_OTHER): Payer: Medicare Other | Admitting: Oncology

## 2015-05-20 ENCOUNTER — Other Ambulatory Visit: Payer: Federal, State, Local not specified - PPO | Admitting: Oncology

## 2015-05-20 ENCOUNTER — Telehealth: Payer: Self-pay | Admitting: Oncology

## 2015-05-20 ENCOUNTER — Other Ambulatory Visit: Payer: Self-pay | Admitting: *Deleted

## 2015-05-20 VITALS — BP 129/54 | HR 60 | Temp 98.0°F | Resp 18 | Ht 67.0 in | Wt 179.9 lb

## 2015-05-20 DIAGNOSIS — D6959 Other secondary thrombocytopenia: Secondary | ICD-10-CM | POA: Diagnosis not present

## 2015-05-20 DIAGNOSIS — D630 Anemia in neoplastic disease: Secondary | ICD-10-CM

## 2015-05-20 DIAGNOSIS — D472 Monoclonal gammopathy: Secondary | ICD-10-CM

## 2015-05-20 DIAGNOSIS — C9000 Multiple myeloma not having achieved remission: Secondary | ICD-10-CM | POA: Diagnosis present

## 2015-05-20 DIAGNOSIS — D7589 Other specified diseases of blood and blood-forming organs: Secondary | ICD-10-CM

## 2015-05-20 LAB — IGG, IGA, IGM
IGG (IMMUNOGLOBIN G), SERUM: 5740 mg/dL — AB (ref 650–1600)
IGM, SERUM: 49 mg/dL (ref 41–251)
IgA: 66 mg/dL — ABNORMAL LOW (ref 68–379)

## 2015-05-20 LAB — PROTEIN ELECTROPHORESIS, SERUM
ALPHA-1-GLOBULIN: 0.3 g/dL (ref 0.2–0.3)
Abnormal Protein Band1: 3.9 g/dL
Albumin ELP: 3.6 g/dL — ABNORMAL LOW (ref 3.8–4.8)
Alpha-2-Globulin: 0.7 g/dL (ref 0.5–0.9)
Beta 2: 0.2 g/dL (ref 0.2–0.5)
Beta Globulin: 0.4 g/dL (ref 0.4–0.6)
Gamma Globulin: 4.4 g/dL — ABNORMAL HIGH (ref 0.8–1.7)
TOTAL PROTEIN, SERUM ELECTROPHOR: 9.6 g/dL — AB (ref 6.1–8.1)

## 2015-05-20 LAB — KAPPA/LAMBDA LIGHT CHAINS
KAPPA LAMBDA RATIO: 24.19 — AB (ref 0.26–1.65)
Kappa free light chain: 15 mg/dL — ABNORMAL HIGH (ref 0.33–1.94)
LAMBDA FREE LGHT CHN: 0.62 mg/dL (ref 0.57–2.63)

## 2015-05-20 MED ORDER — LENALIDOMIDE 25 MG PO CAPS
25.0000 mg | ORAL_CAPSULE | Freq: Every day | ORAL | Status: DC
Start: 2015-05-20 — End: 2015-05-24

## 2015-05-20 MED ORDER — DEXAMETHASONE 4 MG PO TABS: ORAL_TABLET | ORAL | Status: DC

## 2015-05-20 NOTE — Progress Notes (Signed)
  [facility] OFFICE PROGRESS NOTE   Diagnosis: Multiple myeloma  INTERVAL HISTORY:   [name] returns as scheduled. He reports malaise. He has also noted difficulty drifting toward the right when climbing stairs.  Objective:  Vital signs in last 24 hours:  Blood pressure 129/54, pulse 60, temperature 98 F (36.7 C), temperature source Oral, resp. rate 18, height $RemoveBe'5\' 7"'AaaSHxCAe$  (1.702 m), weight 179 lb 14.4 oz (81.602 kg), SpO2 98 %.    Resp: Lungs clear bilaterally Cardio: Regular rate and rhythm GI: No hepatosplenomegaly Vascular: The left lower leg is slightly larger than the right side with chronic stasis change Neuro: The motor exam appears intact in the upper and lower extremities bilaterally, the gait is normal    Lab Results:  Lab Results  Component Value Date   WBC 5.4 05/16/2015   HGB 10.5* 05/16/2015   HCT 29.7* 05/16/2015   MCV 114.2* 05/16/2015   PLT 89* 05/16/2015   NEUTROABS 1.3* 05/16/2015   IgG-5740, free kappa light chains 62  S[place]on M spike 3.9  Medications: I have reviewed the patient's current medications.  Assessment/Plan: 1.Multiple myeloma   Elevated IgG level with a monoclonal protein.. The monoclonal gammopathy dates to 2008   Bone marrow biopsy 05/23/2014 consistent with multiple myeloma, 59% plasma cells-kappa light chain restricted  Negative bone survey 05/31/2014  Initiation of Revlimid/Decadron 6/13 2016 2. Red cell macrocytosis  3. Moderate alcohol use  4. "Neuropathy "of the feet -he describes "burning" of the feet for the past 20 years  5. Mild lymphocytosis -negative peripheral blood flow cytometry 12/27/2013 6. Mild Thrombocytopenia secondary to multiple myeloma  7. Anemia secondary to multiple myeloma    Disposition:  He has progressive multiple myeloma. He agrees to begin systemic therapy. He does not wish to receive Velcade. I recommend Revlimid/Decadron. We reviewed the potential toxicities  associated with this regimen including the chance for hematologic toxicity, nausea, diarrhea, neuropathy, and thromboembolic disease. We discussed the insomnia, peptic ulcer disease, decreased bone density, cataracts, and altered mental status sometimes associated with Decadron. He will attend a chemotherapy teaching class. He agrees to proceed. The plan is to begin a first cycle of Revlimid/Decadron on 05/27/2015. He will return for a lab visit on 06/07/2015 and an office visit on 06/20/2015.  [name], MD  05/20/2015  3:34 PM

## 2015-05-20 NOTE — Telephone Encounter (Signed)
per pof to sch pt appt-gave pt copy of avs °

## 2015-05-21 ENCOUNTER — Telehealth: Payer: Self-pay | Admitting: *Deleted

## 2015-05-21 ENCOUNTER — Encounter: Payer: Self-pay | Admitting: Oncology

## 2015-05-21 NOTE — Telephone Encounter (Signed)
Received message from pt requesting "something for nausea; will be starting chemo (Revlimid) next week"  Note to Dr Benay Spice

## 2015-05-21 NOTE — Progress Notes (Signed)
I faxed [phone] scrip for revlimid and a form that was left in my box???

## 2015-05-22 ENCOUNTER — Other Ambulatory Visit: Payer: Self-pay | Admitting: *Deleted

## 2015-05-22 MED ORDER — PROCHLORPERAZINE MALEATE 5 MG PO TABS: 5.0000 mg | ORAL_TABLET | Freq: Four times a day (QID) | ORAL | Status: AC | PRN

## 2015-05-22 NOTE — Telephone Encounter (Signed)
Notified pt's wife that Compazine has been ordered for nausea and has been sent to their pharmacy per request.  Pt's wife verbalized understanding; also discussed instructions re: taking decadron once a week and she requested copy of lab from 6/2 to be picked up during chemo edu on Friday 6/10.  Re-iterated instructions re: decadron and copy of labs will be available Friday.  Pt's wife verbalized understanding and expressed appreciation for call back.

## 2015-05-24 ENCOUNTER — Other Ambulatory Visit: Payer: Medicare Other

## 2015-05-24 ENCOUNTER — Encounter: Payer: Self-pay | Admitting: *Deleted

## 2015-05-24 ENCOUNTER — Telehealth: Payer: Self-pay | Admitting: *Deleted

## 2015-05-24 ENCOUNTER — Encounter: Payer: Self-pay | Admitting: Oncology

## 2015-05-24 MED ORDER — LENALIDOMIDE 25 MG PO CAPS: 25.0000 mg | ORAL_CAPSULE | Freq: Every day | ORAL | Status: DC

## 2015-05-24 NOTE — Progress Notes (Signed)
I faxed to [facility] for poss asst with revlimid. [phone]. Lavella Lemons said she faxed scrip to them

## 2015-05-24 NOTE — Progress Notes (Unsigned)
Spent time with patient and his wife reviewing side effects of Revlimid.  Teachback done.  Consent signed

## 2015-05-24 NOTE — Telephone Encounter (Signed)
Message from pt stating he has not heard from any pharmacy re: Revlimid Rx. Noted no confirmation # was obtained for 6/6 Rx. Obtained auth. Sent Rx to [facility]. Returned call to pt informing him Rx was sent to specialty pharmacy, they will call him to arrange payment and delivery. Should hear from pharmacy by Monday. He voiced understanding.

## 2015-05-27 ENCOUNTER — Telehealth: Payer: Self-pay | Admitting: *Deleted

## 2015-05-27 NOTE — Telephone Encounter (Signed)
Received message from [facility] that pt requires prior auth for Revlimid. Request forwarded to Raquel in managed care to obtain auth.

## 2015-05-28 ENCOUNTER — Encounter: Payer: Self-pay | Admitting: Oncology

## 2015-05-28 ENCOUNTER — Telehealth: Payer: Self-pay | Admitting: Oncology

## 2015-05-28 MED ORDER — LENALIDOMIDE 25 MG PO CAPS: 25.0000 mg | ORAL_CAPSULE | Freq: Every day | ORAL | Status: DC

## 2015-05-28 NOTE — Telephone Encounter (Signed)
Pt's wife called concerning medication coming in for pt's chemo sent msg to desk nurse to contact pt's wife concerning this matter... KJ

## 2015-05-28 NOTE — Telephone Encounter (Signed)
Called [facility], they are in network for one month. [facility] is the preferred pharmacy for pt's Revlimid. Requested they forward Rx to [facility] to decrease chance of confusion for pt and wife going forward.  Called [facility], spoke with Vaughan Basta. They have pt's prior auth on file. Rx e-prescribed. Called pt with instructions to expect call from [facility] pharmacy to arrange delivery. Contact # given. Reviewed Revlimid safety information. Pt voiced understanding.

## 2015-05-28 NOTE — Progress Notes (Signed)
Per [facility]--[facility] revlimid approved 03/29/15-05/27/16

## 2015-05-28 NOTE — Progress Notes (Signed)
I faxed prior auth for revlimid to [facility]

## 2015-05-30 ENCOUNTER — Telehealth: Payer: Self-pay | Admitting: *Deleted

## 2015-05-30 NOTE — Telephone Encounter (Signed)
PT.'S ICD-10 CODE GIVEN TO [facility].

## 2015-06-03 ENCOUNTER — Other Ambulatory Visit: Payer: Self-pay | Admitting: *Deleted

## 2015-06-03 ENCOUNTER — Telehealth: Payer: Self-pay | Admitting: Oncology

## 2015-06-03 ENCOUNTER — Telehealth: Payer: Self-pay

## 2015-06-03 NOTE — Telephone Encounter (Signed)
lvm for pt regarding to JUNE 24 cx and moved to 6.29 per pof

## 2015-06-03 NOTE — Telephone Encounter (Signed)
Wife called that revlimid was not started until 5/18, do the lab appts and MD appt need to be changed? Per Dr Gearldine Shown note: The plan is to begin a first cycle of Revlimid/Decadron on 05/27/2015. He will return for a lab visit on 06/07/2015 and an office visit on 06/20/2015

## 2015-06-03 NOTE — Progress Notes (Signed)
Left message for pt regarding rescheduling lab appt for 6/29 or 6/30 per Dr. Benay Spice; pt did not start revlimid until 6/18 instead of 6/13. Will keep appt with Dr. Benay Spice on 7/7. POF sent urgent to scheduler. Attempted to reach pt with information. No answer.

## 2015-06-04 ENCOUNTER — Encounter: Payer: Self-pay | Admitting: *Deleted

## 2015-06-04 NOTE — Progress Notes (Signed)
Received fax confirmation from [facility] confirming that Revlimid was dispensed on 05/30/15 to patient.

## 2015-06-07 ENCOUNTER — Other Ambulatory Visit: Payer: Medicare Other

## 2015-06-11 ENCOUNTER — Encounter (INDEPENDENT_AMBULATORY_CARE_PROVIDER_SITE_OTHER): Payer: Medicare Other | Admitting: Ophthalmology

## 2015-06-11 DIAGNOSIS — H3531 Nonexudative age-related macular degeneration: Secondary | ICD-10-CM

## 2015-06-11 DIAGNOSIS — I10 Essential (primary) hypertension: Secondary | ICD-10-CM | POA: Diagnosis not present

## 2015-06-11 DIAGNOSIS — H43813 Vitreous degeneration, bilateral: Secondary | ICD-10-CM

## 2015-06-11 DIAGNOSIS — H3532 Exudative age-related macular degeneration: Secondary | ICD-10-CM

## 2015-06-11 DIAGNOSIS — H35033 Hypertensive retinopathy, bilateral: Secondary | ICD-10-CM

## 2015-06-12 ENCOUNTER — Other Ambulatory Visit (HOSPITAL_BASED_OUTPATIENT_CLINIC_OR_DEPARTMENT_OTHER): Payer: Medicare Other

## 2015-06-12 DIAGNOSIS — D472 Monoclonal gammopathy: Secondary | ICD-10-CM | POA: Diagnosis present

## 2015-06-12 LAB — CBC WITH DIFFERENTIAL/PLATELET
BASO%: 0.1 % (ref 0.0–2.0)
Basophils Absolute: 0 10*3/uL (ref 0.0–0.1)
EOS%: 1.1 % (ref 0.0–7.0)
Eosinophils Absolute: 0.1 10*3/uL (ref 0.0–0.5)
HEMATOCRIT: 30.6 % — AB (ref 38.4–49.9)
HGB: 11.1 g/dL — ABNORMAL LOW (ref 13.0–17.1)
LYMPH#: 3.3 10*3/uL (ref 0.9–3.3)
LYMPH%: 38.4 % (ref 14.0–49.0)
MCH: 39.8 pg — AB (ref 27.2–33.4)
MCHC: 36.3 g/dL — AB (ref 32.0–36.0)
MCV: 109.7 fL — ABNORMAL HIGH (ref 79.3–98.0)
MONO#: 1.5 10*3/uL — AB (ref 0.1–0.9)
MONO%: 17.2 % — ABNORMAL HIGH (ref 0.0–14.0)
NEUT#: 3.7 10*3/uL (ref 1.5–6.5)
NEUT%: 43.2 % (ref 39.0–75.0)
NRBC: 0 % (ref 0–0)
Platelets: 112 10*3/uL — ABNORMAL LOW (ref 140–400)
RBC: 2.79 10*6/uL — AB (ref 4.20–5.82)
RDW: 12.8 % (ref 11.0–14.6)
WBC: 8.5 10*3/uL (ref 4.0–10.3)

## 2015-06-19 ENCOUNTER — Other Ambulatory Visit: Payer: Self-pay | Admitting: *Deleted

## 2015-06-19 MED ORDER — LENALIDOMIDE 25 MG PO CAPS: 25.0000 mg | ORAL_CAPSULE | Freq: Every day | ORAL | Status: DC

## 2015-06-20 ENCOUNTER — Ambulatory Visit (HOSPITAL_BASED_OUTPATIENT_CLINIC_OR_DEPARTMENT_OTHER): Payer: Medicare Other | Admitting: Oncology

## 2015-06-20 ENCOUNTER — Other Ambulatory Visit (HOSPITAL_BASED_OUTPATIENT_CLINIC_OR_DEPARTMENT_OTHER): Payer: Medicare Other

## 2015-06-20 ENCOUNTER — Telehealth: Payer: Self-pay | Admitting: Oncology

## 2015-06-20 VITALS — BP 122/59 | HR 55 | Temp 98.2°F | Resp 17 | Ht 67.0 in | Wt 179.2 lb

## 2015-06-20 DIAGNOSIS — D630 Anemia in neoplastic disease: Secondary | ICD-10-CM

## 2015-06-20 DIAGNOSIS — C9000 Multiple myeloma not having achieved remission: Secondary | ICD-10-CM

## 2015-06-20 DIAGNOSIS — D6959 Other secondary thrombocytopenia: Secondary | ICD-10-CM

## 2015-06-20 DIAGNOSIS — D472 Monoclonal gammopathy: Secondary | ICD-10-CM

## 2015-06-20 DIAGNOSIS — D7589 Other specified diseases of blood and blood-forming organs: Secondary | ICD-10-CM

## 2015-06-20 DIAGNOSIS — D701 Agranulocytosis secondary to cancer chemotherapy: Secondary | ICD-10-CM

## 2015-06-20 LAB — CBC WITH DIFFERENTIAL/PLATELET
BASO%: 0.3 % (ref 0.0–2.0)
Basophils Absolute: 0 10*3/uL (ref 0.0–0.1)
EOS%: 5.2 % (ref 0.0–7.0)
Eosinophils Absolute: 0.3 10*3/uL (ref 0.0–0.5)
HEMATOCRIT: 32.6 % — AB (ref 38.4–49.9)
HGB: 11.4 g/dL — ABNORMAL LOW (ref 13.0–17.1)
LYMPH%: 60 % — AB (ref 14.0–49.0)
MCH: 39.9 pg — ABNORMAL HIGH (ref 27.2–33.4)
MCHC: 35 g/dL (ref 32.0–36.0)
MCV: 114.2 fL — ABNORMAL HIGH (ref 79.3–98.0)
MONO#: 0.8 10*3/uL (ref 0.1–0.9)
MONO%: 15.4 % — ABNORMAL HIGH (ref 0.0–14.0)
NEUT#: 0.9 10*3/uL — ABNORMAL LOW (ref 1.5–6.5)
NEUT%: 19.1 % — AB (ref 39.0–75.0)
Platelets: 114 10*3/uL — ABNORMAL LOW (ref 140–400)
RBC: 2.86 10*6/uL — ABNORMAL LOW (ref 4.20–5.82)
RDW: 13.1 % (ref 11.0–14.6)
WBC: 4.9 10*3/uL (ref 4.0–10.3)
lymph#: 2.9 10*3/uL (ref 0.9–3.3)

## 2015-06-20 NOTE — Progress Notes (Signed)
  [place] OFFICE PROGRESS NOTE   Diagnosis: Multiple myeloma  INTERVAL HISTORY:   [name] returns as scheduled. He completed a first cycle of Revlimid/Decadron beginning on 06/01/2015. No apparent toxicity from the Revlimid. He reports one day of hyperactivity after taking Decadron. This only occurred on one occasion. He reports fatigue.  Objective:  Vital signs in last 24 hours:  Blood pressure 122/59, pulse 55, temperature 98.2 F (36.8 C), temperature source Oral, resp. rate 17, height $RemoveBe'5\' 7"'ajOGPRpFX$  (1.702 m), weight 179 lb 3.2 oz (81.285 kg), SpO2 99 %.    HEENT: No thrush or ulcers Resp: Lungs clear bilaterally Cardio: Regular rate and rhythm GI: No hepatosplenomegaly Vascular: No leg edema   Lab Results:  Lab Results  Component Value Date   WBC 4.9 06/20/2015   HGB 11.4* 06/20/2015   HCT 32.6* 06/20/2015   MCV 114.2* 06/20/2015   PLT 114* 06/20/2015   NEUTROABS 0.9* 06/20/2015     Medications: I have reviewed the patient's current medications.  Assessment/Plan: 1.Multiple myeloma   Elevated IgG level with a monoclonal protein.. The monoclonal gammopathy dates to 2008   Bone marrow biopsy 05/23/2014 consistent with multiple myeloma, 59% plasma cells-kappa light chain restricted  Negative bone survey 05/31/2014  Initiation of Revlimid/Decadron 06/01/2015 2. Red cell macrocytosis  3. Moderate alcohol use  4. "Neuropathy "of the feet -he describes "burning" of the feet for the past 20 years  5. Mild lymphocytosis -negative peripheral blood flow cytometry 12/27/2013 6. Mild Thrombocytopenia secondary to multiple myeloma  7. Anemia secondary to multiple myeloma 8. Neutropenia secondary to Revlimid    Disposition:  [name] tolerated the first cycle of therapy well. We will decrease the Decadron dose if he develops anxiety or emotional changes in the future. He is scheduled to begin a second cycle of Revlimid/Decadron on  06/29/2015. He will return for a CBC on 06/28/2015 to be sure the neutrophil count has recovered.  We will follow-up on the myeloma panel from today. He will return for an office visit 07/24/2015.  [name], MD  06/20/2015  9:52 AM

## 2015-06-20 NOTE — Telephone Encounter (Signed)
Gave and printed appt sched adn avs for pt for pt for July and Aug

## 2015-06-21 ENCOUNTER — Other Ambulatory Visit: Payer: Self-pay | Admitting: *Deleted

## 2015-06-21 MED ORDER — DEXAMETHASONE 4 MG PO TABS: ORAL_TABLET | ORAL | Status: DC

## 2015-06-24 LAB — PROTEIN ELECTROPHORESIS, SERUM
ABNORMAL PROTEIN BAND1: 1.6 g/dL
ALBUMIN ELP: 3.3 g/dL — AB (ref 3.8–4.8)
ALPHA-1-GLOBULIN: 0.3 g/dL (ref 0.2–0.3)
ALPHA-2-GLOBULIN: 0.8 g/dL (ref 0.5–0.9)
BETA 2: 0.2 g/dL (ref 0.2–0.5)
Beta Globulin: 0.4 g/dL (ref 0.4–0.6)
Gamma Globulin: 2 g/dL — ABNORMAL HIGH (ref 0.8–1.7)
TOTAL PROTEIN, SERUM ELECTROPHOR: 6.9 g/dL (ref 6.1–8.1)

## 2015-06-24 LAB — KAPPA/LAMBDA LIGHT CHAINS
Kappa free light chain: 3.43 mg/dL — ABNORMAL HIGH (ref 0.33–1.94)
Kappa:Lambda Ratio: 2.27 — ABNORMAL HIGH (ref 0.26–1.65)
LAMBDA FREE LGHT CHN: 1.51 mg/dL (ref 0.57–2.63)

## 2015-06-24 LAB — IGG: IGG (IMMUNOGLOBIN G), SERUM: 2330 mg/dL — AB (ref 650–1600)

## 2015-06-24 IMAGING — CR DG BONE SURVEY MET
8 of 10 series · 8 of 10 positions shown · non-contrast
Comparison: 05/31/2014.

CLINICAL DATA: 81-year-old male with multiple myeloma. Subsequent
encounter.

EXAM:
METASTATIC BONE SURVEY

[w chest pa]
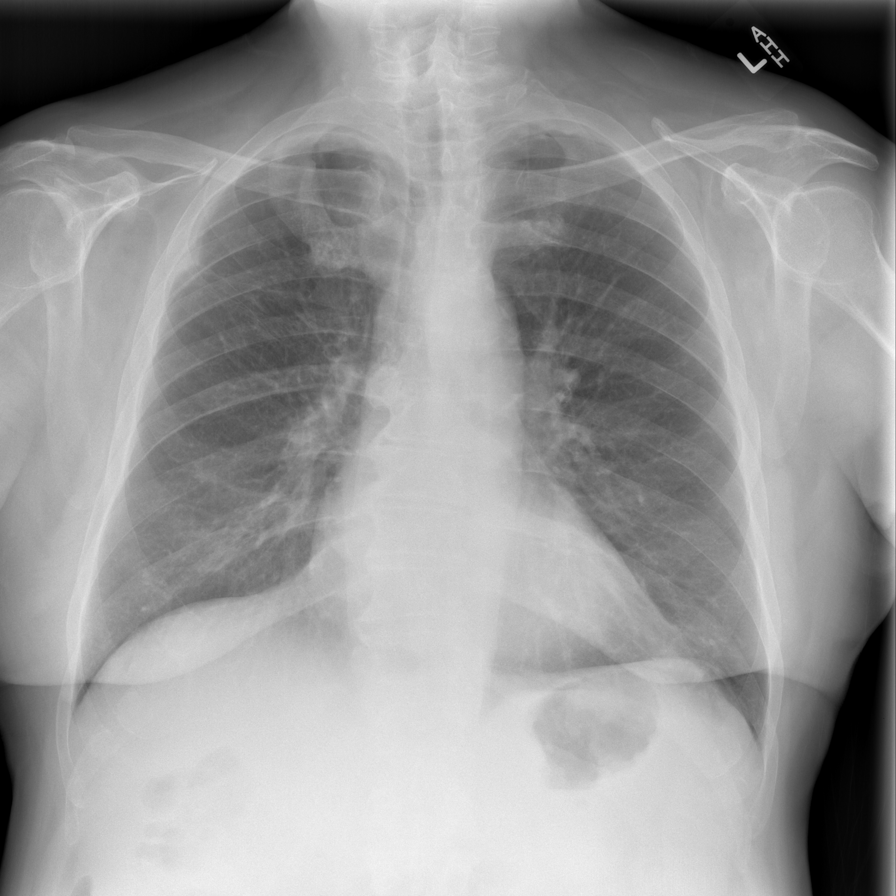

[w c-spine lat]
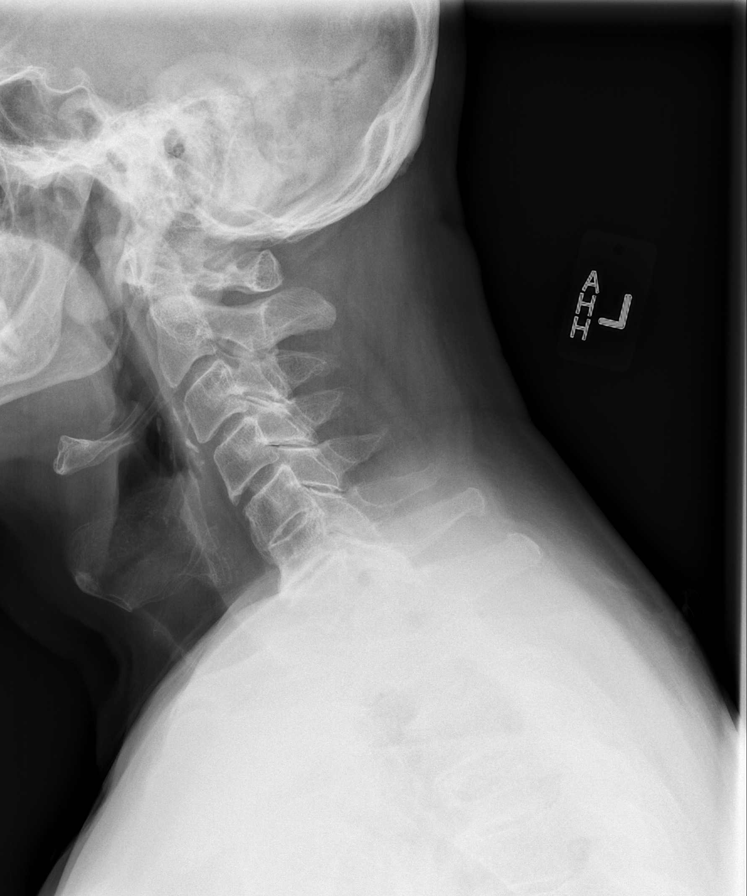

[w skull lat *]
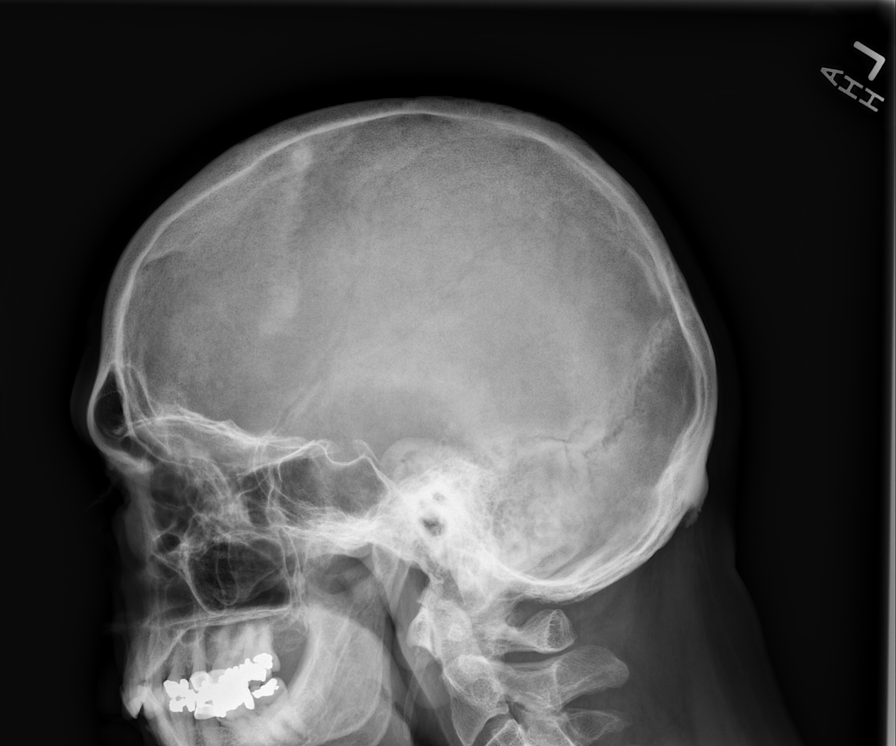

[w c-spine a.p.]
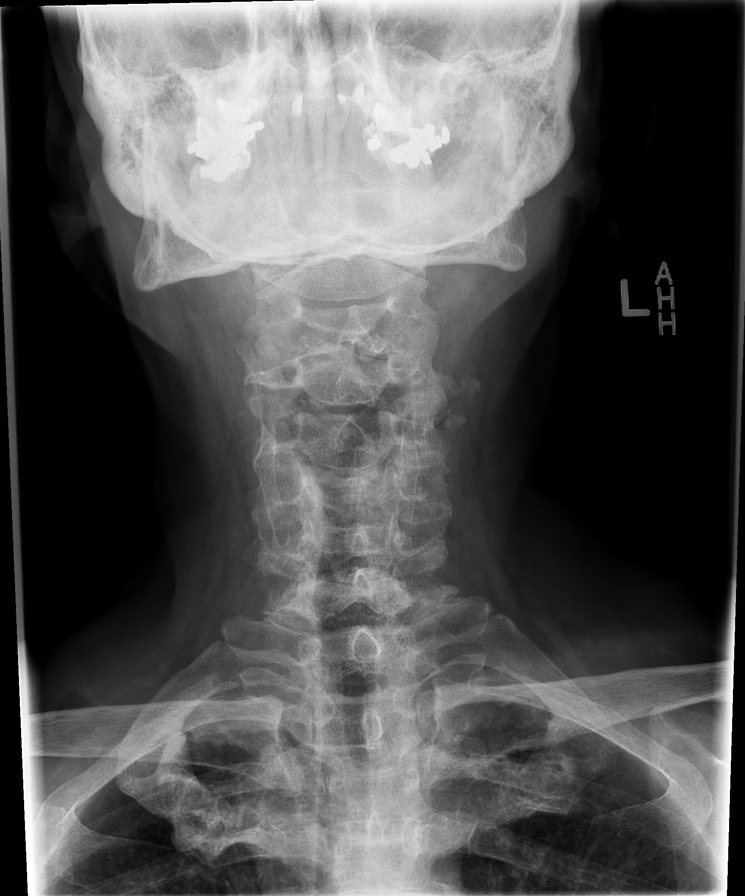

[w swimmers view]
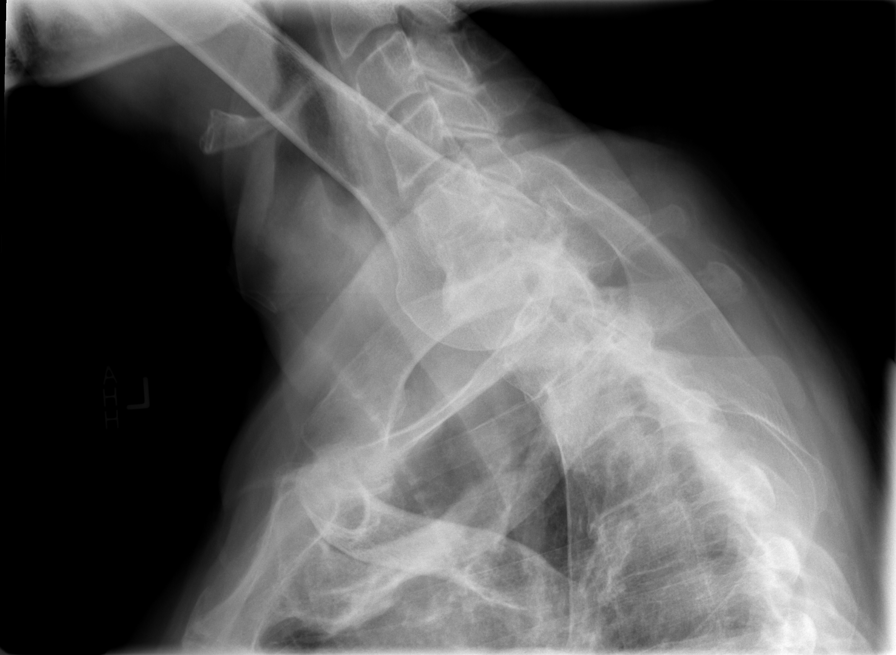

[w humerus ap left *]
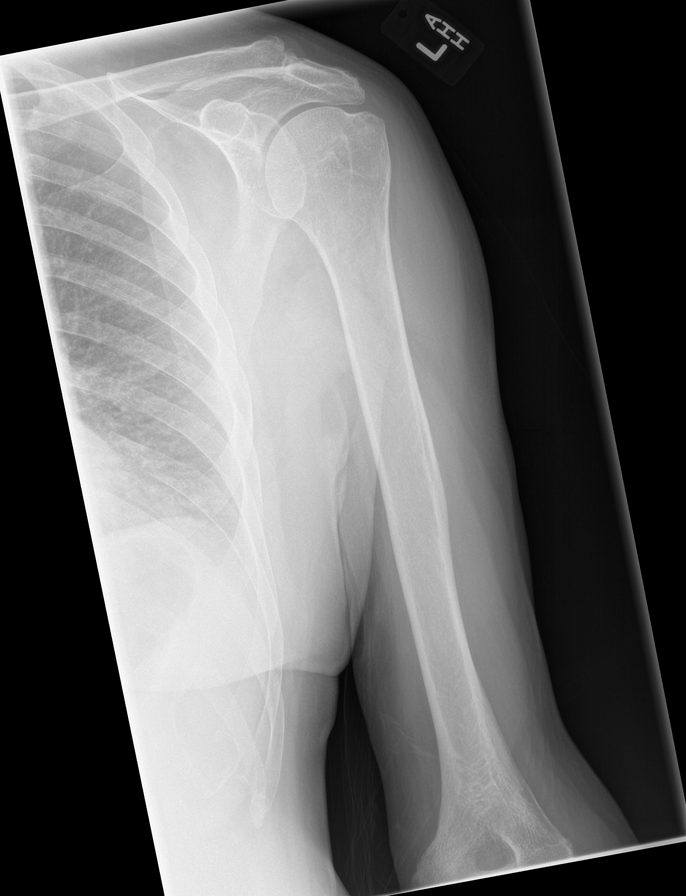

[w shoulder ap external left]
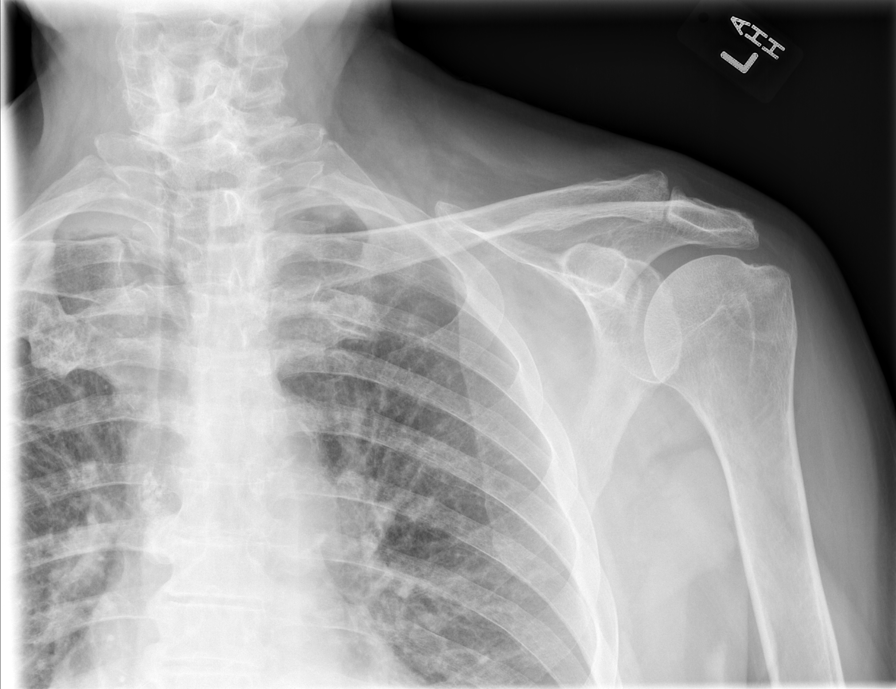

[w shoulder ap external right]
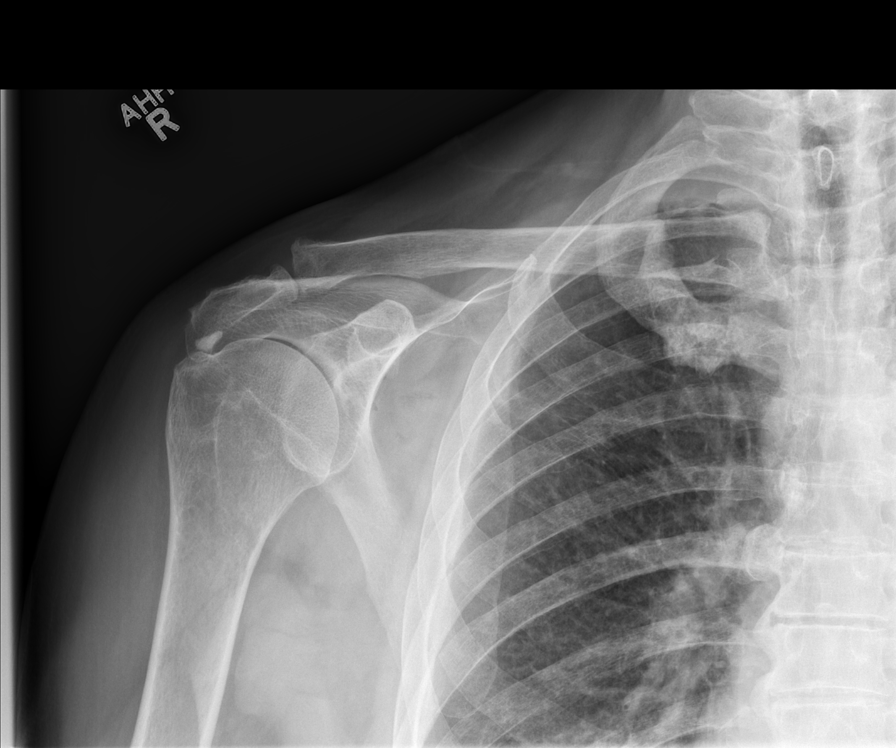

[8 of 10 positions shown; findings below may reference images not displayed]

FINDINGS: Calvarium bone mineralization is stable and within normal limits.

Left greater than right chronic cervical carotid calcified
atherosclerosis.

Cervical spine bone mineralization is stable and within normal
limits. Chronic C6 and C6-C7 disc and endplate degeneration.

Thoracic vertebral height, alignment, and bone mineralization is
stable and within normal limits. Small calcified mediastinal lymph
nodes re- identified.

Normal thoracic and lumbar segmentation. Lumbar vertebral height,
alignment, and bone mineralization stable and within normal limits.
Aortoiliac calcified atherosclerosis noted.

Pelvis and proximal femur bone mineralization stable and within
normal limits. SI joints within normal limits.

Bilateral rib and shoulder bone mineralization stable and within
normal limits. Stable lung parenchyma.

Bilateral upper and lower extremity bone mineralization stable and
within normal limits.
IMPRESSION: Stable and negative skeletal survey. No lytic or acute osseous
abnormality identified.

## 2015-06-26 ENCOUNTER — Telehealth: Payer: Self-pay | Admitting: *Deleted

## 2015-06-26 NOTE — Telephone Encounter (Signed)
Message from pt stating he is returning a call from this office, left message informing him it was likely the phone tree. Left appointment info on voicemail.

## 2015-06-27 ENCOUNTER — Telehealth: Payer: Self-pay

## 2015-06-27 NOTE — Telephone Encounter (Signed)
Wife called stating pt is to start revlimid Saturday and they have not received it yet.

## 2015-06-27 NOTE — Telephone Encounter (Signed)
[facility] said the order has not been set up yet. Out records show order was e-scribed on 06/19/15. [facility].

## 2015-06-27 NOTE — Telephone Encounter (Signed)
S/w wife that [facility] will be [facility] to [facility] delivery. Suggested [facility] b/c the pt will not be at home a large part of today.

## 2015-06-28 ENCOUNTER — Telehealth: Payer: Self-pay | Admitting: *Deleted

## 2015-06-28 ENCOUNTER — Telehealth: Payer: Self-pay | Admitting: Oncology

## 2015-06-28 ENCOUNTER — Other Ambulatory Visit (HOSPITAL_BASED_OUTPATIENT_CLINIC_OR_DEPARTMENT_OTHER): Payer: Medicare Other

## 2015-06-28 DIAGNOSIS — C9000 Multiple myeloma not having achieved remission: Secondary | ICD-10-CM | POA: Diagnosis present

## 2015-06-28 DIAGNOSIS — D472 Monoclonal gammopathy: Secondary | ICD-10-CM

## 2015-06-28 LAB — CBC WITH DIFFERENTIAL/PLATELET
BASO%: 0.5 % (ref 0.0–2.0)
Basophils Absolute: 0 10*3/uL (ref 0.0–0.1)
EOS%: 2.4 % (ref 0.0–7.0)
Eosinophils Absolute: 0.2 10*3/uL (ref 0.0–0.5)
HEMATOCRIT: 32.8 % — AB (ref 38.4–49.9)
HEMOGLOBIN: 11.4 g/dL — AB (ref 13.0–17.1)
LYMPH%: 60.5 % — AB (ref 14.0–49.0)
MCH: 38.8 pg — AB (ref 27.2–33.4)
MCHC: 34.8 g/dL (ref 32.0–36.0)
MCV: 111.6 fL — AB (ref 79.3–98.0)
MONO#: 1.1 10*3/uL — ABNORMAL HIGH (ref 0.1–0.9)
MONO%: 18 % — AB (ref 0.0–14.0)
NEUT%: 18.6 % — AB (ref 39.0–75.0)
NEUTROS ABS: 1.2 10*3/uL — AB (ref 1.5–6.5)
PLATELETS: 144 10*3/uL (ref 140–400)
RBC: 2.94 10*6/uL — AB (ref 4.20–5.82)
RDW: 12.8 % (ref 11.0–14.6)
WBC: 6.3 10*3/uL (ref 4.0–10.3)
lymph#: 3.8 10*3/uL — ABNORMAL HIGH (ref 0.9–3.3)

## 2015-06-28 LAB — COMPREHENSIVE METABOLIC PANEL (CC13)
ALK PHOS: 75 U/L (ref 40–150)
ALT: 23 U/L (ref 0–55)
AST: 36 U/L — ABNORMAL HIGH (ref 5–34)
Albumin: 3.1 g/dL — ABNORMAL LOW (ref 3.5–5.0)
Anion Gap: 9 mEq/L (ref 3–11)
BUN: 5.8 mg/dL — ABNORMAL LOW (ref 7.0–26.0)
CHLORIDE: 99 meq/L (ref 98–109)
CO2: 24 meq/L (ref 22–29)
CREATININE: 0.8 mg/dL (ref 0.7–1.3)
Calcium: 8.4 mg/dL (ref 8.4–10.4)
EGFR: 86 mL/min/{1.73_m2} — AB (ref >90)
GLUCOSE: 108 mg/dL (ref 70–140)
Potassium: 3.5 mEq/L (ref 3.5–5.1)
Sodium: 133 mEq/L — ABNORMAL LOW (ref 136–145)
Total Bilirubin: 0.77 mg/dL (ref 0.20–1.20)
Total Protein: 6.7 g/dL (ref 6.4–8.3)

## 2015-06-28 NOTE — Telephone Encounter (Signed)
Called pt with lab results: Myeloma labs are better, neutrophils are still low. Resume Revlimid and Decadron as scheduled, per Dr. Benay Spice. Neutropenic precautions reviewed. Pt voiced understanding, knows to expect call from schedulers for weekly labs.

## 2015-06-28 NOTE — Telephone Encounter (Signed)
-----   Message from Ladell Pier, MD sent at 06/28/2015  3:51 PM EDT ----- Please call patient, myeloma labs are better, neutrophils are still low, call for fever,  Resume rev/dex as scheduled Return for cbc 1 week, 2 weeks  May need to dose reduce with next cycle

## 2015-06-28 NOTE — Telephone Encounter (Signed)
Lft msg for pt confirming labs added per 07/15 POF, mailed out schedule... KJ

## 2015-07-02 LAB — KAPPA/LAMBDA LIGHT CHAINS
Kappa free light chain: 5.14 mg/dL — ABNORMAL HIGH (ref 0.33–1.94)
Kappa:Lambda Ratio: 2.11 — ABNORMAL HIGH (ref 0.26–1.65)
LAMBDA FREE LGHT CHN: 2.44 mg/dL (ref 0.57–2.63)

## 2015-07-02 LAB — PROTEIN ELECTROPHORESIS, SERUM
ALPHA-1-GLOBULIN: 0.4 g/dL — AB (ref 0.2–0.3)
ALPHA-2-GLOBULIN: 0.7 g/dL (ref 0.5–0.9)
Abnormal Protein Band1: 1.3 g/dL
Albumin ELP: 3.3 g/dL — ABNORMAL LOW (ref 3.8–4.8)
BETA 2: 0.3 g/dL (ref 0.2–0.5)
Beta Globulin: 0.4 g/dL (ref 0.4–0.6)
Gamma Globulin: 1.7 g/dL (ref 0.8–1.7)
Total Protein, Serum Electrophoresis: 6.8 g/dL (ref 6.1–8.1)

## 2015-07-02 LAB — IGG: IgG (Immunoglobin G), Serum: 1800 mg/dL — ABNORMAL HIGH (ref 650–1600)

## 2015-07-05 ENCOUNTER — Telehealth: Payer: Self-pay | Admitting: *Deleted

## 2015-07-05 ENCOUNTER — Other Ambulatory Visit (HOSPITAL_BASED_OUTPATIENT_CLINIC_OR_DEPARTMENT_OTHER): Payer: Medicare Other

## 2015-07-05 DIAGNOSIS — C9000 Multiple myeloma not having achieved remission: Secondary | ICD-10-CM

## 2015-07-05 LAB — CBC WITH DIFFERENTIAL/PLATELET
BASO%: 0.3 % (ref 0.0–2.0)
Basophils Absolute: 0 10*3/uL (ref 0.0–0.1)
EOS%: 3.1 % (ref 0.0–7.0)
Eosinophils Absolute: 0.2 10*3/uL (ref 0.0–0.5)
HCT: 34.3 % — ABNORMAL LOW (ref 38.4–49.9)
HGB: 12 g/dL — ABNORMAL LOW (ref 13.0–17.1)
LYMPH%: 61.2 % — ABNORMAL HIGH (ref 14.0–49.0)
MCH: 38.5 pg — ABNORMAL HIGH (ref 27.2–33.4)
MCHC: 35 g/dL (ref 32.0–36.0)
MCV: 110.1 fL — ABNORMAL HIGH (ref 79.3–98.0)
MONO#: 0.9 10*3/uL (ref 0.1–0.9)
MONO%: 12.1 % (ref 0.0–14.0)
NEUT#: 1.7 10*3/uL (ref 1.5–6.5)
NEUT%: 23.3 % — ABNORMAL LOW (ref 39.0–75.0)
PLATELETS: 135 10*3/uL — AB (ref 140–400)
RBC: 3.12 10*6/uL — AB (ref 4.20–5.82)
RDW: 12.6 % (ref 11.0–14.6)
WBC: 7.5 10*3/uL (ref 4.0–10.3)
lymph#: 4.6 10*3/uL — ABNORMAL HIGH (ref 0.9–3.3)

## 2015-07-05 LAB — TECHNOLOGIST REVIEW

## 2015-07-05 NOTE — Telephone Encounter (Signed)
Made pt aware of information below. Voices understanding and has all appts needed.

## 2015-07-05 NOTE — Telephone Encounter (Signed)
-----   Message from Ladell Pier, MD sent at 07/05/2015 12:37 PM EDT ----- Please call patient, labs ok, continue revlimid as scheduled, f/u next week for cbc as scheduled

## 2015-07-12 ENCOUNTER — Other Ambulatory Visit (HOSPITAL_BASED_OUTPATIENT_CLINIC_OR_DEPARTMENT_OTHER): Payer: Medicare Other

## 2015-07-12 DIAGNOSIS — C9000 Multiple myeloma not having achieved remission: Secondary | ICD-10-CM | POA: Diagnosis present

## 2015-07-12 DIAGNOSIS — D472 Monoclonal gammopathy: Secondary | ICD-10-CM

## 2015-07-12 LAB — CBC WITH DIFFERENTIAL/PLATELET
BASO%: 0.3 % (ref 0.0–2.0)
Basophils Absolute: 0 10*3/uL (ref 0.0–0.1)
EOS%: 3.3 % (ref 0.0–7.0)
Eosinophils Absolute: 0.2 10*3/uL (ref 0.0–0.5)
HCT: 35.8 % — ABNORMAL LOW (ref 38.4–49.9)
HGB: 12.3 g/dL — ABNORMAL LOW (ref 13.0–17.1)
LYMPH%: 42.1 % (ref 14.0–49.0)
MCH: 37.6 pg — ABNORMAL HIGH (ref 27.2–33.4)
MCHC: 34.4 g/dL (ref 32.0–36.0)
MCV: 109.3 fL — ABNORMAL HIGH (ref 79.3–98.0)
MONO#: 1.3 10*3/uL — ABNORMAL HIGH (ref 0.1–0.9)
MONO%: 18 % — ABNORMAL HIGH (ref 0.0–14.0)
NEUT#: 2.7 10*3/uL (ref 1.5–6.5)
NEUT%: 36.3 % — ABNORMAL LOW (ref 39.0–75.0)
Platelets: 122 10*3/uL — ABNORMAL LOW (ref 140–400)
RBC: 3.28 10*6/uL — ABNORMAL LOW (ref 4.20–5.82)
RDW: 12.9 % (ref 11.0–14.6)
WBC: 7.5 10*3/uL (ref 4.0–10.3)
lymph#: 3.1 10*3/uL (ref 0.9–3.3)

## 2015-07-23 ENCOUNTER — Encounter (INDEPENDENT_AMBULATORY_CARE_PROVIDER_SITE_OTHER): Payer: Medicare Other | Admitting: Ophthalmology

## 2015-07-23 DIAGNOSIS — H3531 Nonexudative age-related macular degeneration: Secondary | ICD-10-CM | POA: Diagnosis not present

## 2015-07-23 DIAGNOSIS — H35033 Hypertensive retinopathy, bilateral: Secondary | ICD-10-CM | POA: Diagnosis not present

## 2015-07-23 DIAGNOSIS — I10 Essential (primary) hypertension: Secondary | ICD-10-CM

## 2015-07-23 DIAGNOSIS — H43813 Vitreous degeneration, bilateral: Secondary | ICD-10-CM | POA: Diagnosis not present

## 2015-07-23 DIAGNOSIS — H3532 Exudative age-related macular degeneration: Secondary | ICD-10-CM | POA: Diagnosis not present

## 2015-07-24 ENCOUNTER — Other Ambulatory Visit (HOSPITAL_BASED_OUTPATIENT_CLINIC_OR_DEPARTMENT_OTHER): Payer: Medicare Other

## 2015-07-24 ENCOUNTER — Ambulatory Visit (HOSPITAL_BASED_OUTPATIENT_CLINIC_OR_DEPARTMENT_OTHER): Payer: Medicare Other | Admitting: Oncology

## 2015-07-24 ENCOUNTER — Other Ambulatory Visit: Payer: Self-pay | Admitting: *Deleted

## 2015-07-24 ENCOUNTER — Ambulatory Visit (HOSPITAL_COMMUNITY)
Admission: RE | Admit: 2015-07-24 | Discharge: 2015-07-24 | Disposition: A | Discharge status: Home / Self Care | Payer: Medicare Other | Source: Ambulatory Visit | Attending: Oncology | Admitting: Oncology

## 2015-07-24 VITALS — BP 117/51 | HR 54 | Temp 98.0°F | Resp 17 | Ht 67.0 in | Wt 178.7 lb

## 2015-07-24 DIAGNOSIS — D472 Monoclonal gammopathy: Secondary | ICD-10-CM

## 2015-07-24 DIAGNOSIS — D701 Agranulocytosis secondary to cancer chemotherapy: Secondary | ICD-10-CM

## 2015-07-24 DIAGNOSIS — C9000 Multiple myeloma not having achieved remission: Secondary | ICD-10-CM | POA: Diagnosis present

## 2015-07-24 DIAGNOSIS — R609 Edema, unspecified: Secondary | ICD-10-CM

## 2015-07-24 DIAGNOSIS — M7989 Other specified soft tissue disorders: Secondary | ICD-10-CM | POA: Diagnosis present

## 2015-07-24 DIAGNOSIS — D630 Anemia in neoplastic disease: Secondary | ICD-10-CM | POA: Diagnosis not present

## 2015-07-24 LAB — CBC WITH DIFFERENTIAL/PLATELET
BASO%: 0.7 % (ref 0.0–2.0)
Basophils Absolute: 0.1 10*3/uL (ref 0.0–0.1)
EOS%: 3 % (ref 0.0–7.0)
Eosinophils Absolute: 0.2 10*3/uL (ref 0.0–0.5)
HEMATOCRIT: 34.3 % — AB (ref 38.4–49.9)
HEMOGLOBIN: 12.5 g/dL — AB (ref 13.0–17.1)
LYMPH#: 3.6 10*3/uL — AB (ref 0.9–3.3)
LYMPH%: 50.4 % — ABNORMAL HIGH (ref 14.0–49.0)
MCH: 37.2 pg — AB (ref 27.2–33.4)
MCHC: 36.4 g/dL — ABNORMAL HIGH (ref 32.0–36.0)
MCV: 102.1 fL — ABNORMAL HIGH (ref 79.3–98.0)
MONO#: 1.4 10*3/uL — AB (ref 0.1–0.9)
MONO%: 19.1 % — ABNORMAL HIGH (ref 0.0–14.0)
NEUT#: 1.9 10*3/uL (ref 1.5–6.5)
NEUT%: 26.8 % — AB (ref 39.0–75.0)
Platelets: 140 10*3/uL (ref 140–400)
RBC: 3.36 10*6/uL — ABNORMAL LOW (ref 4.20–5.82)
RDW: 12.4 % (ref 11.0–14.6)
WBC: 7.1 10*3/uL (ref 4.0–10.3)

## 2015-07-24 MED ORDER — DEXAMETHASONE 4 MG PO TABS: 20.0000 mg | ORAL_TABLET | ORAL | Status: DC

## 2015-07-24 MED ORDER — LENALIDOMIDE 25 MG PO CAPS: 25.0000 mg | ORAL_CAPSULE | Freq: Every day | ORAL | Status: DC

## 2015-07-24 NOTE — Progress Notes (Signed)
  [place] OFFICE PROGRESS NOTE   Diagnosis: Multiple myeloma  INTERVAL HISTORY:   [name] returns as scheduled. He completed another cycle of Revlimid on 07/19/2015. He reports feeling well. No nausea, diarrhea, or neuropathy symptoms. He has noted increased swelling in the right greater than left lower leg and foot. Discomfort at the medial aspect of the right ankle  Objective:  Vital signs in last 24 hours:  Blood pressure 117/51, pulse 54, temperature 98 F (36.7 C), temperature source Oral, resp. rate 17, height $RemoveBe'5\' 7"'SsNnKLXrD$  (1.702 m), weight 178 lb 11.2 oz (81.058 kg), SpO2 99 %.    HEENT: No thrush or ulcers Resp: Lungs clear bilaterally Cardio: Regular rate and rhythm GI: No hepatosplenomegaly Vascular: 1+ pitting edema at the right lower leg and ankle, trace edema at the left lower leg and ankle   Lab Results:  Lab Results  Component Value Date   WBC 7.1 07/24/2015   HGB 12.5* 07/24/2015   HCT 34.3* 07/24/2015   MCV 102.1* 07/24/2015   PLT 140 07/24/2015   NEUTROABS 1.9 07/24/2015   06/28/2015: IgG 1800, M spike 1.3 Medications: I have reviewed the patient's current medications.  Assessment/Plan: 1.Multiple myeloma   Elevated IgG level with a monoclonal protein.. The monoclonal gammopathy dates to 2008   Bone marrow biopsy 05/23/2014 consistent with multiple myeloma, 59% plasma cells-kappa light chain restricted  Negative bone survey 05/31/2014  Initiation of Revlimid/Decadron 06/01/2015  Cycle 2 Revlimid/Decadron 06/29/2015  Cycle 3 Revlimid/Decadron 07/27/2015 2. Red cell macrocytosis  3. Moderate alcohol use  4. "Neuropathy "of the feet -he describes "burning" of the feet for the past 20 years  5. Mild lymphocytosis -negative peripheral blood flow cytometry 12/27/2013 6. Mild Thrombocytopenia secondary to multiple myeloma -improved 7. Anemia secondary to multiple myeloma-improved 8. Neutropenia secondary to Revlimid 9.  Asymmetric leg edema-likely secondary to Decadron, he will be referred for a Doppler to rule out a deep vein thrombosis    Disposition:  Mr. Katzenstein has completed 2 cycles of Revlimid/Decadron. The serum M spike and anemia have improved. He will begin cycle 3 on 07/27/2015. He will return for a CBC and myeloma panel in approximately 2 weeks. He will be scheduled for an office visit in one month.  He will be referred for a Doppler of the lower extremities to rule out a deep vein thrombosis. He continues aspirin prophylaxis. We decrease the Decadron to 20 mg.  [name], MD  07/24/2015  2:20 PM

## 2015-07-24 NOTE — Progress Notes (Signed)
Bilateral lower extremity venous duplex completed:  No evidence of DVT, superficial thrombosis, or Baker's cyst.   

## 2015-08-12 ENCOUNTER — Other Ambulatory Visit (HOSPITAL_BASED_OUTPATIENT_CLINIC_OR_DEPARTMENT_OTHER): Payer: Medicare Other

## 2015-08-12 DIAGNOSIS — C9000 Multiple myeloma not having achieved remission: Secondary | ICD-10-CM | POA: Diagnosis present

## 2015-08-12 LAB — CBC WITH DIFFERENTIAL/PLATELET
BASO%: 0.6 % (ref 0.0–2.0)
Basophils Absolute: 0 10*3/uL (ref 0.0–0.1)
EOS%: 5.2 % (ref 0.0–7.0)
Eosinophils Absolute: 0.4 10*3/uL (ref 0.0–0.5)
HCT: 36.3 % — ABNORMAL LOW (ref 38.4–49.9)
HEMOGLOBIN: 12.7 g/dL — AB (ref 13.0–17.1)
LYMPH#: 3 10*3/uL (ref 0.9–3.3)
LYMPH%: 40.1 % (ref 14.0–49.0)
MCH: 36.1 pg — ABNORMAL HIGH (ref 27.2–33.4)
MCHC: 35 g/dL (ref 32.0–36.0)
MCV: 103.2 fL — ABNORMAL HIGH (ref 79.3–98.0)
MONO#: 1 10*3/uL — ABNORMAL HIGH (ref 0.1–0.9)
MONO%: 13.1 % (ref 0.0–14.0)
NEUT#: 3 10*3/uL (ref 1.5–6.5)
NEUT%: 41 % (ref 39.0–75.0)
Platelets: 169 10*3/uL (ref 140–400)
RBC: 3.52 10*6/uL — ABNORMAL LOW (ref 4.20–5.82)
RDW: 13.4 % (ref 11.0–14.6)
WBC: 7.4 10*3/uL (ref 4.0–10.3)

## 2015-08-12 LAB — COMPREHENSIVE METABOLIC PANEL (CC13)
ALBUMIN: 3.1 g/dL — AB (ref 3.5–5.0)
ALK PHOS: 92 U/L (ref 40–150)
ALT: 13 U/L (ref 0–55)
AST: 21 U/L (ref 5–34)
Anion Gap: 10 mEq/L (ref 3–11)
BILIRUBIN TOTAL: 0.86 mg/dL (ref 0.20–1.20)
BUN: 5.9 mg/dL — ABNORMAL LOW (ref 7.0–26.0)
CALCIUM: 8.8 mg/dL (ref 8.4–10.4)
CO2: 26 mEq/L (ref 22–29)
Chloride: 102 mEq/L (ref 98–109)
Creatinine: 0.7 mg/dL (ref 0.7–1.3)
EGFR: 88 mL/min/{1.73_m2} — AB (ref >90)
Glucose: 157 mg/dl — ABNORMAL HIGH (ref 70–140)
Potassium: 3.4 mEq/L — ABNORMAL LOW (ref 3.5–5.1)
Sodium: 139 mEq/L (ref 136–145)
Total Protein: 6.3 g/dL — ABNORMAL LOW (ref 6.4–8.3)

## 2015-08-14 LAB — PROTEIN ELECTROPHORESIS, SERUM
ABNORMAL PROTEIN BAND1: 0.5 g/dL
Albumin ELP: 3.3 g/dL — ABNORMAL LOW (ref 3.8–4.8)
Alpha-1-Globulin: 0.4 g/dL — ABNORMAL HIGH (ref 0.2–0.3)
Alpha-2-Globulin: 0.8 g/dL (ref 0.5–0.9)
BETA GLOBULIN: 0.4 g/dL (ref 0.4–0.6)
Beta 2: 0.3 g/dL (ref 0.2–0.5)
Gamma Globulin: 1.1 g/dL (ref 0.8–1.7)
TOTAL PROTEIN, SERUM ELECTROPHOR: 6.2 g/dL (ref 6.1–8.1)

## 2015-08-14 LAB — IGG: IGG (IMMUNOGLOBIN G), SERUM: 1050 mg/dL (ref 650–1600)

## 2015-08-14 LAB — KAPPA/LAMBDA LIGHT CHAINS
Kappa free light chain: 3.66 mg/dL — ABNORMAL HIGH (ref 0.33–1.94)
Kappa:Lambda Ratio: 1.35 (ref 0.26–1.65)
Lambda Free Lght Chn: 2.71 mg/dL — ABNORMAL HIGH (ref 0.57–2.63)

## 2015-08-21 ENCOUNTER — Telehealth: Payer: Self-pay | Admitting: Oncology

## 2015-08-21 ENCOUNTER — Other Ambulatory Visit (HOSPITAL_BASED_OUTPATIENT_CLINIC_OR_DEPARTMENT_OTHER): Payer: Medicare Other

## 2015-08-21 ENCOUNTER — Ambulatory Visit (HOSPITAL_BASED_OUTPATIENT_CLINIC_OR_DEPARTMENT_OTHER): Payer: Medicare Other | Admitting: Oncology

## 2015-08-21 VITALS — BP 135/66 | HR 57 | Temp 98.5°F | Resp 16 | Ht 67.0 in | Wt 181.3 lb

## 2015-08-21 DIAGNOSIS — Z23 Encounter for immunization: Secondary | ICD-10-CM

## 2015-08-21 DIAGNOSIS — C9000 Multiple myeloma not having achieved remission: Secondary | ICD-10-CM

## 2015-08-21 DIAGNOSIS — D472 Monoclonal gammopathy: Secondary | ICD-10-CM

## 2015-08-21 LAB — CBC WITH DIFFERENTIAL/PLATELET
BASO%: 0.3 % (ref 0.0–2.0)
BASOS ABS: 0 10*3/uL (ref 0.0–0.1)
EOS ABS: 0.4 10*3/uL (ref 0.0–0.5)
EOS%: 5.2 % (ref 0.0–7.0)
HEMATOCRIT: 36.9 % — AB (ref 38.4–49.9)
HEMOGLOBIN: 13.1 g/dL (ref 13.0–17.1)
LYMPH#: 3.5 10*3/uL — AB (ref 0.9–3.3)
LYMPH%: 45.7 % (ref 14.0–49.0)
MCH: 35.3 pg — AB (ref 27.2–33.4)
MCHC: 35.5 g/dL (ref 32.0–36.0)
MCV: 99.5 fL — AB (ref 79.3–98.0)
MONO#: 1.8 10*3/uL — AB (ref 0.1–0.9)
MONO%: 22.9 % — ABNORMAL HIGH (ref 0.0–14.0)
NEUT%: 25.9 % — ABNORMAL LOW (ref 39.0–75.0)
NEUTROS ABS: 2 10*3/uL (ref 1.5–6.5)
Platelets: 149 10*3/uL (ref 140–400)
RBC: 3.71 10*6/uL — ABNORMAL LOW (ref 4.20–5.82)
RDW: 12.8 % (ref 11.0–14.6)
WBC: 7.6 10*3/uL (ref 4.0–10.3)

## 2015-08-21 MED ORDER — INFLUENZA VAC SPLIT QUAD 0.5 ML IM SUSY
0.5000 mL | PREFILLED_SYRINGE | Freq: Once | INTRAMUSCULAR | Status: AC
  Administered 2015-08-21: 0.5 mL via INTRAMUSCULAR
  Filled 2015-08-21: qty 0.5

## 2015-08-21 NOTE — Progress Notes (Signed)
  [place] OFFICE PROGRESS NOTE   Diagnosis: Multiple myeloma  INTERVAL HISTORY:   [name] returns as scheduled. He continues Revlimid/Decadron. The emotions have improved with the decreased dose of Decadron. No new complaint. Chronic balance difficulty.  Objective:  Vital signs in last 24 hours:  Blood pressure 135/66, pulse 57, temperature 98.5 F (36.9 C), temperature source Oral, resp. rate 16, height $RemoveBe'5\' 7"'nPSBBxBvz$  (1.702 m), weight 181 lb 4.8 oz (82.237 kg), SpO2 100 %.    HEENT: No thrush Resp: Lungs clear bilaterally Cardio: Regular rate and rhythm GI: Obese, no hepatosplenomegaly Vascular: Pitting edema at the left greater than right lower leg   Lab Results:  Lab Results  Component Value Date   WBC 7.6 08/21/2015   HGB 13.1 08/21/2015   HCT 36.9* 08/21/2015   MCV 99.5* 08/21/2015   PLT 149 08/21/2015   NEUTROABS 2.0 08/21/2015    08/12/2015-IgG 1050, M spike 0.5   Imaging:  No results found.  Medications: I have reviewed the patient's current medications.  Assessment/Plan: 1.Multiple myeloma   Elevated IgG level with a monoclonal protein.. The monoclonal gammopathy dates to 2008   Bone marrow biopsy 05/23/2014 consistent with multiple myeloma, 59% plasma cells-kappa light chain restricted  Negative bone survey 05/31/2014  Initiation of Revlimid/Decadron 06/01/2015  Cycle 2 Revlimid/Decadron 06/29/2015  Cycle 3 Revlimid/Decadron 07/27/2015  Cycle 4 Revlimid/Decadron 08/26/2015 2. Red cell macrocytosis  3. Moderate alcohol use  4. "Neuropathy "of the feet -he describes "burning" of the feet for the past 20 years  5. Mild lymphocytosis -negative peripheral blood flow cytometry 12/27/2013 6. Mild Thrombocytopenia secondary to multiple myeloma -improved 7. Anemia secondary to multiple myeloma-improved 8. Neutropenia secondary to Revlimid 9. Asymmetric leg edema-likely secondary to Decadron, negative bilateral lower extremity  Doppler 07/24/2015  Disposition:  [name] continues to tolerate the chemotherapy well. There has been clinical and laboratory improvement. He will proceed with cycle 4 Revlimid/Decadron on 08/26/2015.  He received an influenza vaccine today. He will be sure he is up-to-date on the pneumonia vaccines with Dr. Felipa Eth.  [name] will return for an office and lab visit 09/19/2015.  [name], MD  08/21/2015  12:34 PM

## 2015-08-21 NOTE — Telephone Encounter (Signed)
Appointments made and avs printed for patient °

## 2015-08-22 ENCOUNTER — Other Ambulatory Visit: Payer: Self-pay | Admitting: *Deleted

## 2015-08-22 MED ORDER — LENALIDOMIDE 25 MG PO CAPS: 25.0000 mg | ORAL_CAPSULE | Freq: Every day | ORAL | Status: DC

## 2015-08-23 ENCOUNTER — Telehealth: Payer: Self-pay | Admitting: *Deleted

## 2015-08-23 NOTE — Telephone Encounter (Signed)
DR.SHERRILL'S NURSE, ALEXIS ROBINSON,RN WILL CALL PHARMACY.

## 2015-08-23 NOTE — Telephone Encounter (Signed)
Revlimid script escribed at 1044am 08/22/15. [facility]; spoke to [place] this morning 08/23/15 at [facility] stating Revlimid is scheduled for delivery tomorrow.

## 2015-08-26 ENCOUNTER — Encounter (INDEPENDENT_AMBULATORY_CARE_PROVIDER_SITE_OTHER): Payer: Medicare Other | Admitting: Ophthalmology

## 2015-08-26 DIAGNOSIS — I10 Essential (primary) hypertension: Secondary | ICD-10-CM | POA: Diagnosis not present

## 2015-08-26 DIAGNOSIS — H35033 Hypertensive retinopathy, bilateral: Secondary | ICD-10-CM | POA: Diagnosis not present

## 2015-08-26 DIAGNOSIS — H3532 Exudative age-related macular degeneration: Secondary | ICD-10-CM

## 2015-08-26 DIAGNOSIS — H3531 Nonexudative age-related macular degeneration: Secondary | ICD-10-CM

## 2015-08-26 DIAGNOSIS — H43813 Vitreous degeneration, bilateral: Secondary | ICD-10-CM

## 2015-08-28 ENCOUNTER — Encounter (INDEPENDENT_AMBULATORY_CARE_PROVIDER_SITE_OTHER): Payer: Medicare Other | Admitting: Ophthalmology

## 2015-09-19 ENCOUNTER — Other Ambulatory Visit: Payer: Self-pay | Admitting: *Deleted

## 2015-09-19 ENCOUNTER — Ambulatory Visit (HOSPITAL_BASED_OUTPATIENT_CLINIC_OR_DEPARTMENT_OTHER): Payer: Medicare Other | Admitting: Nurse Practitioner

## 2015-09-19 ENCOUNTER — Other Ambulatory Visit (HOSPITAL_BASED_OUTPATIENT_CLINIC_OR_DEPARTMENT_OTHER): Payer: Medicare Other

## 2015-09-19 ENCOUNTER — Telehealth: Payer: Self-pay | Admitting: Oncology

## 2015-09-19 VITALS — BP 125/50 | HR 63 | Temp 98.4°F | Resp 17 | Ht 67.0 in | Wt 179.5 lb

## 2015-09-19 DIAGNOSIS — D696 Thrombocytopenia, unspecified: Secondary | ICD-10-CM

## 2015-09-19 DIAGNOSIS — R609 Edema, unspecified: Secondary | ICD-10-CM

## 2015-09-19 DIAGNOSIS — D472 Monoclonal gammopathy: Secondary | ICD-10-CM

## 2015-09-19 DIAGNOSIS — R799 Abnormal finding of blood chemistry, unspecified: Secondary | ICD-10-CM

## 2015-09-19 DIAGNOSIS — D630 Anemia in neoplastic disease: Secondary | ICD-10-CM | POA: Diagnosis not present

## 2015-09-19 DIAGNOSIS — C9000 Multiple myeloma not having achieved remission: Secondary | ICD-10-CM

## 2015-09-19 LAB — CBC WITH DIFFERENTIAL/PLATELET
BASO%: 1 % (ref 0.0–2.0)
Basophils Absolute: 0.1 10*3/uL (ref 0.0–0.1)
EOS%: 3.1 % (ref 0.0–7.0)
Eosinophils Absolute: 0.2 10*3/uL (ref 0.0–0.5)
HEMATOCRIT: 41.4 % (ref 38.4–49.9)
HEMOGLOBIN: 14.2 g/dL (ref 13.0–17.1)
LYMPH#: 2.8 10*3/uL (ref 0.9–3.3)
LYMPH%: 38.2 % (ref 14.0–49.0)
MCH: 33.9 pg — ABNORMAL HIGH (ref 27.2–33.4)
MCHC: 34.3 g/dL (ref 32.0–36.0)
MCV: 98.8 fL — ABNORMAL HIGH (ref 79.3–98.0)
MONO#: 1.7 10*3/uL — AB (ref 0.1–0.9)
MONO%: 22.4 % — ABNORMAL HIGH (ref 0.0–14.0)
NEUT%: 35.3 % — ABNORMAL LOW (ref 39.0–75.0)
NEUTROS ABS: 2.6 10*3/uL (ref 1.5–6.5)
Platelets: 215 10*3/uL (ref 140–400)
RBC: 4.19 10*6/uL — ABNORMAL LOW (ref 4.20–5.82)
RDW: 14.4 % (ref 11.0–14.6)
WBC: 7.4 10*3/uL (ref 4.0–10.3)

## 2015-09-19 LAB — COMPREHENSIVE METABOLIC PANEL (CC13)
ALBUMIN: 3.4 g/dL — AB (ref 3.5–5.0)
ALK PHOS: 92 U/L (ref 40–150)
ALT: 11 U/L (ref 0–55)
AST: 18 U/L (ref 5–34)
Anion Gap: 10 mEq/L (ref 3–11)
BILIRUBIN TOTAL: 1.21 mg/dL — AB (ref 0.20–1.20)
BUN: 6.9 mg/dL — AB (ref 7.0–26.0)
CALCIUM: 8.8 mg/dL (ref 8.4–10.4)
CHLORIDE: 99 meq/L (ref 98–109)
CO2: 28 mEq/L (ref 22–29)
CREATININE: 0.7 mg/dL (ref 0.7–1.3)
EGFR: 87 mL/min/{1.73_m2} — ABNORMAL LOW (ref >90)
Glucose: 119 mg/dl (ref 70–140)
Potassium: 3.5 mEq/L (ref 3.5–5.1)
Sodium: 136 mEq/L (ref 136–145)
TOTAL PROTEIN: 6.8 g/dL (ref 6.4–8.3)

## 2015-09-19 MED ORDER — LENALIDOMIDE 25 MG PO CAPS: 25.0000 mg | ORAL_CAPSULE | Freq: Every day | ORAL | Status: DC

## 2015-09-19 NOTE — Progress Notes (Signed)
[facility] at [phone].

## 2015-09-19 NOTE — Telephone Encounter (Signed)
Gave adn printed appt sched and avs fo rpt for NOV °

## 2015-09-19 NOTE — Progress Notes (Addendum)
  [place] OFFICE PROGRESS NOTE   Diagnosis:  Multiple myeloma  INTERVAL HISTORY:   [name] returns as scheduled. He began cycle 4 Revlimid/Decadron on 08/26/2015. He feels well. He denies nausea/vomiting. He has noted a few "sores" at the lower inner lip. No associated discomfort. No diarrhea. No rash. No numbness or tingling in his hands or feet. He denies pain.  Objective:  Vital signs in last 24 hours:  Blood pressure 125/50, pulse 63, temperature 98.4 F (36.9 C), temperature source Oral, resp. rate 17, height $RemoveBe'5\' 7"'LZVbmlhbu$  (1.702 m), weight 179 lb 8 oz (81.421 kg), SpO2 99 %.    HEENT: Several tiny ulcerations at the lower inner lip. Resp: Lungs clear bilaterally. Cardio: Regular rate and rhythm. GI: Abdomen soft and nontender. No hepatomegaly. Vascular: Pitting edema at the left greater than right lower leg.   Lab Results:  Lab Results  Component Value Date   WBC 7.4 09/19/2015   HGB 14.2 09/19/2015   HCT 41.4 09/19/2015   MCV 98.8* 09/19/2015   PLT 215 09/19/2015   NEUTROABS 2.6 09/19/2015    Medications: I have reviewed the patient's current medications.  Assessment/Plan: 1.Multiple myeloma   Elevated IgG level with a monoclonal protein.. The monoclonal gammopathy dates to 2008   Bone marrow biopsy 05/23/2014 consistent with multiple myeloma, 59% plasma cells-kappa light chain restricted  Negative bone survey 05/31/2014  Initiation of Revlimid/Decadron 06/01/2015  Cycle 2 Revlimid/Decadron 06/29/2015  Cycle 3 Revlimid/Decadron 07/27/2015  Cycle 4 Revlimid/Decadron 08/26/2015  Cycle 5 Revlimid/Decadron 09/23/2015 2. Red cell macrocytosis  3. Moderate alcohol use  4. "Neuropathy "of the feet -he describes "burning" of the feet for the past 20 years  5. Mild lymphocytosis -negative peripheral blood flow cytometry 12/27/2013 6. Mild Thrombocytopenia secondary to multiple myeloma -improved 7. Anemia secondary to multiple  myeloma-improved 8. Neutropenia secondary to Revlimid 9. Asymmetric leg edema-likely secondary to Decadron, negative bilateral lower extremity Doppler 07/24/2015   Disposition: Mr. Skalicky appears stable. He has completed 4 cycles of Revlimid/Decadron with clinical and laboratory improvement. Plan to proceed with cycle 5 as scheduled beginning 09/23/2015.  He will return for a follow-up visit in 4 weeks. He will contact the office in the interim with any problems.  Patient seen with Dr. Benay Spice.    Ned Card ANP/GNP-BC   09/19/2015  11:53 AM  This was a shared visit with Ned Card. Mr. Gernert was interviewed and examined. The plan is to proceed with cycle 5 Revlimid/Decadron on 09/23/2015. We plan to switch to maintenance Revlimid after cycle 6.  Julieanne Manson, M.D.

## 2015-09-23 LAB — PROTEIN ELECTROPHORESIS, SERUM
ALBUMIN ELP: 3.2 g/dL — AB (ref 3.8–4.8)
Alpha-1-Globulin: 0.5 g/dL — ABNORMAL HIGH (ref 0.2–0.3)
Alpha-2-Globulin: 0.9 g/dL (ref 0.5–0.9)
BETA GLOBULIN: 0.4 g/dL (ref 0.4–0.6)
Beta 2: 0.3 g/dL (ref 0.2–0.5)
Gamma Globulin: 0.9 g/dL (ref 0.8–1.7)
TOTAL PROTEIN, SERUM ELECTROPHOR: 6.2 g/dL (ref 6.1–8.1)

## 2015-09-23 LAB — KAPPA/LAMBDA LIGHT CHAINS
KAPPA LAMBDA RATIO: 1.31 (ref 0.26–1.65)
Kappa free light chain: 3.27 mg/dL — ABNORMAL HIGH (ref 0.33–1.94)
Lambda Free Lght Chn: 2.5 mg/dL (ref 0.57–2.63)

## 2015-09-23 LAB — IGG: IgG (Immunoglobin G), Serum: 936 mg/dL (ref 650–1600)

## 2015-09-24 ENCOUNTER — Other Ambulatory Visit: Payer: Self-pay | Admitting: *Deleted

## 2015-10-01 ENCOUNTER — Encounter (INDEPENDENT_AMBULATORY_CARE_PROVIDER_SITE_OTHER): Payer: Medicare Other | Admitting: Ophthalmology

## 2015-10-01 DIAGNOSIS — H353121 Nonexudative age-related macular degeneration, left eye, early dry stage: Secondary | ICD-10-CM | POA: Diagnosis not present

## 2015-10-01 DIAGNOSIS — H35033 Hypertensive retinopathy, bilateral: Secondary | ICD-10-CM

## 2015-10-01 DIAGNOSIS — I10 Essential (primary) hypertension: Secondary | ICD-10-CM | POA: Diagnosis not present

## 2015-10-01 DIAGNOSIS — H43813 Vitreous degeneration, bilateral: Secondary | ICD-10-CM | POA: Diagnosis not present

## 2015-10-01 DIAGNOSIS — H353211 Exudative age-related macular degeneration, right eye, with active choroidal neovascularization: Secondary | ICD-10-CM

## 2015-10-07 ENCOUNTER — Telehealth: Payer: Self-pay | Admitting: Oncology

## 2015-10-07 ENCOUNTER — Other Ambulatory Visit: Payer: Self-pay | Admitting: Oncology

## 2015-10-07 NOTE — Telephone Encounter (Signed)
Patients wife called to reschedule appointments as they are going out of town   anne

## 2015-10-10 ENCOUNTER — Ambulatory Visit (HOSPITAL_BASED_OUTPATIENT_CLINIC_OR_DEPARTMENT_OTHER): Payer: Medicare Other | Admitting: Nurse Practitioner

## 2015-10-10 ENCOUNTER — Telehealth: Payer: Self-pay | Admitting: Nurse Practitioner

## 2015-10-10 ENCOUNTER — Other Ambulatory Visit (HOSPITAL_BASED_OUTPATIENT_CLINIC_OR_DEPARTMENT_OTHER): Payer: Medicare Other

## 2015-10-10 ENCOUNTER — Other Ambulatory Visit: Payer: Self-pay | Admitting: Oncology

## 2015-10-10 VITALS — BP 137/57 | HR 60 | Temp 98.3°F | Resp 18 | Ht 67.0 in | Wt 178.8 lb

## 2015-10-10 DIAGNOSIS — R269 Unspecified abnormalities of gait and mobility: Secondary | ICD-10-CM

## 2015-10-10 DIAGNOSIS — R799 Abnormal finding of blood chemistry, unspecified: Secondary | ICD-10-CM | POA: Diagnosis not present

## 2015-10-10 DIAGNOSIS — C9000 Multiple myeloma not having achieved remission: Secondary | ICD-10-CM

## 2015-10-10 DIAGNOSIS — D630 Anemia in neoplastic disease: Secondary | ICD-10-CM

## 2015-10-10 LAB — COMPREHENSIVE METABOLIC PANEL (CC13)
ALT: 26 U/L (ref 0–55)
ANION GAP: 8 meq/L (ref 3–11)
AST: 32 U/L (ref 5–34)
Albumin: 3.6 g/dL (ref 3.5–5.0)
Alkaline Phosphatase: 64 U/L (ref 40–150)
BUN: 8.3 mg/dL (ref 7.0–26.0)
CALCIUM: 9.1 mg/dL (ref 8.4–10.4)
CHLORIDE: 96 meq/L — AB (ref 98–109)
CO2: 32 mEq/L — ABNORMAL HIGH (ref 22–29)
CREATININE: 0.8 mg/dL (ref 0.7–1.3)
EGFR: 86 mL/min/{1.73_m2} — AB (ref >90)
Glucose: 123 mg/dl (ref 70–140)
POTASSIUM: 3.6 meq/L (ref 3.5–5.1)
Sodium: 136 mEq/L (ref 136–145)
Total Bilirubin: 1.99 mg/dL — ABNORMAL HIGH (ref 0.20–1.20)
Total Protein: 6.4 g/dL (ref 6.4–8.3)

## 2015-10-10 LAB — CBC WITH DIFFERENTIAL/PLATELET
BASO%: 0.4 % (ref 0.0–2.0)
BASOS ABS: 0 10*3/uL (ref 0.0–0.1)
EOS ABS: 0.4 10*3/uL (ref 0.0–0.5)
EOS%: 5 % (ref 0.0–7.0)
HEMATOCRIT: 42.4 % (ref 38.4–49.9)
HGB: 14.5 g/dL (ref 13.0–17.1)
LYMPH#: 3 10*3/uL (ref 0.9–3.3)
LYMPH%: 37.2 % (ref 14.0–49.0)
MCH: 34 pg — AB (ref 27.2–33.4)
MCHC: 34.2 g/dL (ref 32.0–36.0)
MCV: 99.3 fL — ABNORMAL HIGH (ref 79.3–98.0)
MONO#: 1 10*3/uL — AB (ref 0.1–0.9)
MONO%: 12.9 % (ref 0.0–14.0)
NEUT#: 3.6 10*3/uL (ref 1.5–6.5)
NEUT%: 44.5 % (ref 39.0–75.0)
PLATELETS: 128 10*3/uL — AB (ref 140–400)
RBC: 4.27 10*6/uL (ref 4.20–5.82)
RDW: 15.5 % — ABNORMAL HIGH (ref 11.0–14.6)
WBC: 8.1 10*3/uL (ref 4.0–10.3)

## 2015-10-10 MED ORDER — LENALIDOMIDE 25 MG PO CAPS: 25.0000 mg | ORAL_CAPSULE | Freq: Every day | ORAL | Status: DC

## 2015-10-10 NOTE — Telephone Encounter (Signed)
Prescription [facility]ed to [facility] for Revlimid authorization # [id number] to start on 10/21/15

## 2015-10-10 NOTE — Telephone Encounter (Signed)
per pof to sch pt appt-gave pt copy of avs °

## 2015-10-10 NOTE — Progress Notes (Addendum)
  [place] OFFICE PROGRESS NOTE   Diagnosis:  Multiple myeloma  INTERVAL HISTORY:   Mr. Mccuiston returns as scheduled. He began cycle 5 Revlimid/Decadron beginning 09/23/2015. He denies nausea/vomiting. No mouth sores. No diarrhea. No numbness or tingling in his hands or feet. For the past few weeks he has felt "weak". He has noted an unsteady "shuffling" gait. He fears he is going to fall. He has had some difficulty maneuvering from a sitting to standing position. No unusual headaches or vision change. No focal extremity weakness.   Objective:  Vital signs in last 24 hours:  Blood pressure 137/57, pulse 60, temperature 98.3 F (36.8 C), temperature source Oral, resp. rate 18, height $RemoveBe'5\' 7"'lmuJPMQLQ$  (1.702 m), weight 178 lb 12.8 oz (81.103 kg), SpO2 100 %.    HEENT: No thrush or ulcers. Resp: Lungs clear bilaterally. Cardio: Regular rate and rhythm. GI: Abdomen soft and nontender. No organomegaly. Vascular: Pitting edema at the lower legs bilaterally left greater than right. Neuro: Alert and oriented. Motor strength 5 over 5. Finger to nose and rapid alternating movement intact. Slow, slightly unsteady gait. Marked difficulty with heel toe gait.   Lab Results:  Lab Results  Component Value Date   WBC 7.4 09/19/2015   HGB 14.2 09/19/2015   HCT 41.4 09/19/2015   MCV 98.8* 09/19/2015   PLT 215 09/19/2015   NEUTROABS 2.6 09/19/2015    Imaging:  No results found.  Medications: I have reviewed the patient's current medications.  Assessment/Plan: 1.Multiple myeloma   Elevated IgG level with a monoclonal protein.. The monoclonal gammopathy dates to 2008   Bone marrow biopsy 05/23/2014 consistent with multiple myeloma, 59% plasma cells-kappa light chain restricted  Negative bone survey 05/31/2014  Initiation of Revlimid/Decadron 06/01/2015  Cycle 2 Revlimid/Decadron 06/29/2015  Cycle 3 Revlimid/Decadron 07/27/2015  Cycle 4 Revlimid/Decadron  08/26/2015  Cycle 5 Revlimid/Decadron 09/23/2015  Cycle 6 Revlimid/Decadron 10/21/2015 2. Red cell macrocytosis  3. Moderate alcohol use  4. "Neuropathy "of the feet -he describes "burning" of the feet for the past 20 years  5. Mild lymphocytosis -negative peripheral blood flow cytometry 12/27/2013 6. Mild Thrombocytopenia secondary to multiple myeloma -improved 7. Anemia secondary to multiple myeloma-improved 8. Neutropenia secondary to Revlimid 9. Asymmetric leg edema-likely secondary to Decadron, negative bilateral lower extremity Doppler 07/24/2015   Disposition: Mr. Kittell is completing cycle 5 Revlimid/dexamethasone. Most recent myeloma labs show further improvement. The plan is to proceed with cycle 6 beginning 10/21/2015.  He has a gait disturbance. Etiology unclear. He would like evaluation by a neurologist. His wife plans to contact her neurologist for an appointment. She will contact us if a referral as needed.  He will return for a follow-up visit on 11/12/2015. He will contact the office in the interim with any problems.  Patient seen with Dr. Benay Spice.    Ned Card ANP/GNP-BC   10/10/2015  11:22 AM  This was a shared visit with Ned Card. The multiple myeloma has responded to Revlimid/Decadron. I doubt the gait disturbances related to Revlimid. We will make a neurology referral.  Julieanne Manson, M.D.

## 2015-10-11 ENCOUNTER — Telehealth: Payer: Self-pay | Admitting: *Deleted

## 2015-10-11 ENCOUNTER — Telehealth: Payer: Self-pay | Admitting: Oncology

## 2015-10-11 ENCOUNTER — Other Ambulatory Visit: Payer: Self-pay | Admitting: *Deleted

## 2015-10-11 DIAGNOSIS — C9000 Multiple myeloma not having achieved remission: Secondary | ICD-10-CM | POA: Insufficient documentation

## 2015-10-11 NOTE — Telephone Encounter (Signed)
Prescription for Revlimid was faxed to [facility] [phone] authorization # [id number] on 10/10/15 to start 10/21/15

## 2015-10-11 NOTE — Telephone Encounter (Signed)
Pt's wife left message this am asking "I tried to get an appt with my neuro doctor at [facility] and they don't have anything until mid Dec.  Could Dr. Benay Spice do something to get [place] in earlier?"  Per Dr. Benay Spice; notified pt's wife that urgent Neuro referral has been sent to Dr. Carles Collet or Dr. Posey Pronto at [facility] and they hopefully will hear from scheduler next week.  Pt's wife verbalized understanding and expressed appreciation for call back.

## 2015-10-14 LAB — PROTEIN ELECTROPHORESIS, SERUM
ALPHA-1-GLOBULIN: 0.3 g/dL (ref 0.2–0.3)
ALPHA-2-GLOBULIN: 0.9 g/dL (ref 0.5–0.9)
Albumin ELP: 3.5 g/dL — ABNORMAL LOW (ref 3.8–4.8)
Beta 2: 0.3 g/dL (ref 0.2–0.5)
Beta Globulin: 0.4 g/dL (ref 0.4–0.6)
GAMMA GLOBULIN: 0.8 g/dL (ref 0.8–1.7)
Total Protein, Serum Electrophoresis: 6.2 g/dL (ref 6.1–8.1)

## 2015-10-14 LAB — KAPPA/LAMBDA LIGHT CHAINS
KAPPA LAMBDA RATIO: 1.25 (ref 0.26–1.65)
Kappa free light chain: 2.14 mg/dL — ABNORMAL HIGH (ref 0.33–1.94)
LAMBDA FREE LGHT CHN: 1.71 mg/dL (ref 0.57–2.63)

## 2015-10-16 ENCOUNTER — Other Ambulatory Visit: Payer: Medicare Other

## 2015-10-16 ENCOUNTER — Ambulatory Visit: Payer: Medicare Other | Admitting: Oncology

## 2015-10-21 ENCOUNTER — Other Ambulatory Visit: Payer: Self-pay | Admitting: Oncology

## 2015-10-22 ENCOUNTER — Telehealth: Payer: Self-pay | Admitting: Oncology

## 2015-10-22 ENCOUNTER — Other Ambulatory Visit: Payer: Self-pay | Admitting: *Deleted

## 2015-10-22 ENCOUNTER — Telehealth: Payer: Self-pay | Admitting: *Deleted

## 2015-10-22 NOTE — Telephone Encounter (Signed)
Received call from wife Butch Penny requesting refill of Dexamethasone ASAP since pt is out of med and the specialty pharmacy does not have meds in yet.  [facility] [address] Butch Penny also stated she has not heard from the neurology referral office yet;  Butch Penny needs update on this referral. Donna's   Phone    [phone].

## 2015-10-22 NOTE — Telephone Encounter (Signed)
Spoke with Dawn at [facility] and rerouted referral to their office. Office will contact patient re appointment. No other orders per 11/8 pof. Left message informing him that Velora Heckler will call re appointment.

## 2015-10-29 ENCOUNTER — Encounter: Payer: Self-pay | Admitting: Neurology

## 2015-10-29 ENCOUNTER — Ambulatory Visit (INDEPENDENT_AMBULATORY_CARE_PROVIDER_SITE_OTHER): Payer: Medicare Other | Admitting: Neurology

## 2015-10-29 VITALS — BP 120/67 | HR 70 | Ht 67.0 in | Wt 179.0 lb

## 2015-10-29 DIAGNOSIS — G629 Polyneuropathy, unspecified: Secondary | ICD-10-CM | POA: Diagnosis not present

## 2015-10-29 DIAGNOSIS — C9000 Multiple myeloma not having achieved remission: Secondary | ICD-10-CM | POA: Diagnosis not present

## 2015-10-29 NOTE — Progress Notes (Signed)
[name] was seen today in the movement disorders clinic for neurologic consultation at the request of Dr. Learta Codding.  His PCP is Mathews Argyle, MD.  The consultation is for the evaluation of gait instability.  The records that were made available to me were reviewed.  The patient is a 81 y/o M with multiple myeloma who reports gait change/balance issue for 6+ months; pt attributed it to "myeloma treatment" but his wife states that it happened before that.  Pt states that when he first stands up he will "drift to the right" and then take a few small steps to get started.  No falls.  Reports that feet feel normal - no paresthesias.  States that the myeloma medication makes his legs swell.   Specific Symptoms:  Tremor: No. Voice: maybe minor, but wife states that he still has strong voice Sleep: trouble staying asleep  Vivid Dreams:  No.  Acting out dreams:  No.   Sleeps in recliner and uses cpap some but not consistent Wet Pillows: No. Postural symptoms:  Yes.    Falls?  No. Bradykinesia symptoms: slow movements, difficulty getting out of a chair and difficulty regaining balance; minor shuffling (states that he does this on purpose to make sure that he has his balance) Loss of smell:  Yes.   (attributes to hx of tobacco) Loss of taste:  Yes.  , but minimally Urinary Incontinence:  No. Difficulty Swallowing:  No. Handwriting, micrographia: No. Trouble with ADL's:  No.  Trouble buttoning clothing: No. Depression:  No. (wife states that perhaps more "irritable") Memory changes:  Yes.   (states that still able to pay bills; still driving and no problems; remembers to take own medication) Hallucinations:  No.  visual distortions: No. N/V:  No. Lightheaded:  No.  Syncope: No. Diplopia:  No. Dyskinesia:  No.  Neuroimaging has previously been performed.  It is not available for my review today.    ALLERGIES:   Allergies  Allergen Reactions  . Hydrochlorothiazide Other  (See Comments)    hyponatremia    CURRENT MEDICATIONS:  Outpatient Encounter Prescriptions as of 10/29/2015  Medication Sig  . aspirin 81 MG tablet Take 81 mg by mouth daily.    Marland Kitchen atenolol (TENORMIN) 50 MG tablet Take 75 mg by mouth every morning.   . cyanocobalamin 2000 MCG tablet Take 2,000 mcg by mouth every Monday, Wednesday, and Friday at 6 PM.  . dexamethasone (DECADRON) 4 MG tablet TAKE 5 TABLETS (20 MG TOTAL) BY MOUTH ONCE A WEEK.  . furosemide (LASIX) 20 MG tablet Take 20 mg by mouth every morning.   Marland Kitchen ibuprofen (ADVIL,MOTRIN) 800 MG tablet Take 800 mg by mouth every 8 (eight) hours as needed for mild pain.   Marland Kitchen lenalidomide (REVLIMID) 25 MG capsule Take 1 capsule (25 mg total) by mouth daily. For 21 days. Then 7 days off.  . levothyroxine (SYNTHROID, LEVOTHROID) 150 MCG tablet Take 150 mcg by mouth daily.  . pantoprazole (PROTONIX) 40 MG tablet Take 40 mg by mouth daily.    . Potassium Chloride CR (MICRO-K) 8 MEQ CPCR capsule CR Take 1 capsule by mouth daily. with food  . prochlorperazine (COMPAZINE) 5 MG tablet Take 1 tablet (5 mg total) by mouth every 6 (six) hours as needed for nausea or vomiting.  . simvastatin (ZOCOR) 20 MG tablet Take 20 mg by mouth at bedtime.    Marland Kitchen trimethoprim-polymyxin b (POLYTRIM) ophthalmic solution Place 1 drop into the right eye every 30 (thirty)  days. After injection  . [DISCONTINUED] traMADol (ULTRAM) 50 MG tablet Take 1 tablet (50 mg total) by mouth every 6 (six) hours as needed.   No facility-administered encounter medications on file as of 10/29/2015.    PAST MEDICAL HISTORY:   Past Medical History  Diagnosis Date  . Hypertension   . Hyperlipemia   . GERD (gastroesophageal reflux disease)   . Irritable bowel syndrome (IBS)   . Dupuytren's contracture   . Vitamin B12 deficiency   . Macular degeneration   . Barrett's esophagus   . Elevated MCV     , B12 and normal b12  . Hypothyroidism   . Wears glasses   . Multiple myeloma ([place])   .  Hypoglycemia     PAST SURGICAL HISTORY:   Past Surgical History  Procedure Laterality Date  . Skin cancer excision      Left ear (basal cell)  . Tonsillectomy      age 82 1/4  . Colonoscopy w/ polypectomy      at 81yo---benign  . Esophagogastroduodenoscopy    . Cataract extraction Bilateral   . Fasciotomy Left 05/02/2014    Procedure: FASCIOTOMY LEFT THUMB 1ST WEB SMALL FINGER ;  Surgeon: Wynonia Sours, MD;  Location: [facility];  Service: Orthopedics;  Laterality: Left;  . Esophagogastroduodenoscopy N/A 10/04/2014    Procedure: ESOPHAGOGASTRODUODENOSCOPY (EGD);  Surgeon: Garlan Fair, MD;  Location: [facility];  Service: Endoscopy;  Laterality: N/A;  . Fasciotomy Right 12/18/2014    Procedure: FASCIOTOMY RIGHT THUMB;  Surgeon: Daryll Brod, MD;  Location: [facility];  Service: Orthopedics;  Laterality: Right;  . Fasciectomy Right 12/18/2014    Procedure: FASCIECTOMY FIRST WEB SPACE RIGHT;  Surgeon: Daryll Brod, MD;  Location: [facility];  Service: Orthopedics;  Laterality: Right;    SOCIAL HISTORY:   Social History   Social History  . Marital Status: Married    Spouse Name: N/A  . Number of Children: N/A  . Years of Education: N/A   Occupational History  . retired     Educational psychologist, [employer]    Social History Main Topics  . Smoking status: Former Smoker -- 81 years    Quit date: 12/13/1988  . Smokeless tobacco: Not on file  . Alcohol Use: 0.0 oz/week    0 Standard drinks or equivalent per week     Comment: 2-3  drinks daily ([place])  . Drug Use: No  . Sexual Activity: Not on file   Other Topics Concern  . Not on file   Social History Narrative    FAMILY HISTORY:   Family Status  Relation Status Death Age  . Mother Deceased 77    old age  . Father Deceased     lung cancer (tob use)  . Brother Deceased     lung cancer (tob use)  . Child Alive     3 biological, 1 adopted, alive and well    ROS:   A complete 10 system review of systems was obtained and was unremarkable apart from what is mentioned above.  PHYSICAL EXAMINATION:    VITALS:   Filed Vitals:   10/29/15 1235  BP: 120/67  Pulse: 70  Height: 5' 7"  (1.702 m)  Weight: 179 lb (81.194 kg)    GEN:  The patient appears stated age and is in NAD. HEENT:  Normocephalic, atraumatic.  The mucous membranes are moist. The superficial temporal arteries are without ropiness or tenderness. CV:  RRR  Lungs:  CTAB Neck/HEME:  There are no carotid bruits bilaterally.  Neurological examination:  Orientation: The patient is alert and oriented x3. Fund of knowledge is appropriate.  Recent and remote memory are intact.  Attention and concentration are normal.    Able to name objects and repeat phrases. Cranial nerves: There is good facial symmetry. Pupils are equal round and reactive to light bilaterally. Fundoscopic exam reveals clear margins bilaterally. Extraocular muscles are intact. The visual fields are full to confrontational testing. The speech is fluent and clear. Soft palate rises symmetrically and there is no tongue deviation. Hearing is intact to conversational tone. Sensation: Sensation is intact to light and pinprick throughout (facial, trunk, extremities). Pinprick is not decreased in a stocking distribution.  Vibration is decreased at the bilateral big toe, ankle and knee compared to the UE. There is no extinction with double simultaneous stimulation. There is no sensory dermatomal level identified.  Proprioception is mildly decreased at the bilateral big toe.   Motor: Strength is 5/5 in the bilateral upper and lower extremities.   Shoulder shrug is equal and symmetric.  There is no pronator drift. Deep tendon reflexes: Deep tendon reflexes are 2/4 at the bilateral biceps, triceps, brachioradialis, patella and absent at the bilateral achilles. Plantar responses are downgoing bilaterally.  Movement examination: Tone: There is  normal tone in the bilateral upper extremities.  The tone in the lower extremities is normal.  Abnormal movements: none even with distraction Coordination:  There is no decremation with RAM's, with any form of RAMS, including alternating supination and pronation of the forearm, hand opening and closing, finger taps, heel taps and toe taps. Gait and Station: The patient has minimal difficulty arising out of a deep-seated chair without the use of the hands. The patient's stride length is normal with wide based gait.     Labs:  Lab Results  Component Value Date   VITAMINB12 431 12/27/2013     Chemistry      Component Value Date/Time   NA 136 10/10/2015 1108   NA 138 05/01/2014 0900   K 3.6 10/10/2015 1108   K 3.8 05/01/2014 0900   CL 101 05/01/2014 0900   CO2 32* 10/10/2015 1108   CO2 25 05/01/2014 0900   BUN 8.3 10/10/2015 1108   BUN 8 05/01/2014 0900   CREATININE 0.8 10/10/2015 1108   CREATININE 0.72 05/01/2014 0900      Component Value Date/Time   CALCIUM 9.1 10/10/2015 1108   CALCIUM 8.5 05/01/2014 0900   ALKPHOS 64 10/10/2015 1108   ALKPHOS 55 11/26/2011 1525   AST 32 10/10/2015 1108   AST 39* 11/26/2011 1525   ALT 26 10/10/2015 1108   ALT 31 11/26/2011 1525   BILITOT 1.99* 10/10/2015 1108   BILITOT 0.6 11/26/2011 1525     No results found for: TSH   ASSESSMENT/PLAN:  1.  Gait instability  -This is likely due to peripheral neuropathy.  He has strong clinical examination evidence of this.  This may be secondary to alcohol.  Myeloma can also contribute.  He and I talked about decreasing alcohol and hopefully discontinuing it.  He reports that he is doing well from a myeloma standpoint.  We talked about safety associated with peripheral neuropathy.  We talked about balance therapy, but the patient states that "my balance is not that bad, I just wanted to make sure that something more progressive was not going on."  In addition, he is moving to [place] in December.  I told  him if he changed his mind, he could certainly do balance therapy in [place].  -He reports that he has had a recent B12 as well as a recent normal glucose (actually has a history of significant hypoglycemia with syncopal episodes). 2.  He is moving to [place] so f/u prn.  Much greater than 50% of this visit was spent in counseling with the patient and the family.  Total face to face time:  45 min

## 2015-10-31 ENCOUNTER — Other Ambulatory Visit: Payer: Self-pay | Admitting: Oncology

## 2015-11-05 ENCOUNTER — Encounter (INDEPENDENT_AMBULATORY_CARE_PROVIDER_SITE_OTHER): Payer: Medicare Other | Admitting: Ophthalmology

## 2015-11-05 DIAGNOSIS — H35033 Hypertensive retinopathy, bilateral: Secondary | ICD-10-CM | POA: Diagnosis not present

## 2015-11-05 DIAGNOSIS — H353121 Nonexudative age-related macular degeneration, left eye, early dry stage: Secondary | ICD-10-CM | POA: Diagnosis not present

## 2015-11-05 DIAGNOSIS — I10 Essential (primary) hypertension: Secondary | ICD-10-CM | POA: Diagnosis not present

## 2015-11-05 DIAGNOSIS — H353211 Exudative age-related macular degeneration, right eye, with active choroidal neovascularization: Secondary | ICD-10-CM

## 2015-11-05 DIAGNOSIS — H43813 Vitreous degeneration, bilateral: Secondary | ICD-10-CM | POA: Diagnosis not present

## 2015-11-12 ENCOUNTER — Ambulatory Visit (HOSPITAL_BASED_OUTPATIENT_CLINIC_OR_DEPARTMENT_OTHER): Payer: Medicare Other | Admitting: Oncology

## 2015-11-12 ENCOUNTER — Other Ambulatory Visit (HOSPITAL_BASED_OUTPATIENT_CLINIC_OR_DEPARTMENT_OTHER): Payer: Medicare Other

## 2015-11-12 ENCOUNTER — Telehealth: Payer: Self-pay | Admitting: Oncology

## 2015-11-12 VITALS — BP 122/41 | HR 57 | Temp 98.2°F | Resp 18 | Ht 67.0 in | Wt 180.9 lb

## 2015-11-12 DIAGNOSIS — D701 Agranulocytosis secondary to cancer chemotherapy: Secondary | ICD-10-CM

## 2015-11-12 DIAGNOSIS — D6959 Other secondary thrombocytopenia: Secondary | ICD-10-CM

## 2015-11-12 DIAGNOSIS — C9000 Multiple myeloma not having achieved remission: Secondary | ICD-10-CM | POA: Diagnosis present

## 2015-11-12 DIAGNOSIS — D630 Anemia in neoplastic disease: Secondary | ICD-10-CM

## 2015-11-12 LAB — CBC WITH DIFFERENTIAL/PLATELET
BASO%: 0.8 % (ref 0.0–2.0)
BASOS ABS: 0 10*3/uL (ref 0.0–0.1)
EOS ABS: 0.2 10*3/uL (ref 0.0–0.5)
EOS%: 2.7 % (ref 0.0–7.0)
HCT: 37.7 % — ABNORMAL LOW (ref 38.4–49.9)
HEMOGLOBIN: 12.8 g/dL — AB (ref 13.0–17.1)
LYMPH%: 43.8 % (ref 14.0–49.0)
MCH: 33.5 pg — ABNORMAL HIGH (ref 27.2–33.4)
MCHC: 33.9 g/dL (ref 32.0–36.0)
MCV: 98.6 fL — AB (ref 79.3–98.0)
MONO#: 1 10*3/uL — AB (ref 0.1–0.9)
MONO%: 18 % — ABNORMAL HIGH (ref 0.0–14.0)
NEUT%: 34.7 % — ABNORMAL LOW (ref 39.0–75.0)
NEUTROS ABS: 2 10*3/uL (ref 1.5–6.5)
PLATELETS: 174 10*3/uL (ref 140–400)
RBC: 3.82 10*6/uL — AB (ref 4.20–5.82)
RDW: 16.6 % — ABNORMAL HIGH (ref 11.0–14.6)
WBC: 5.6 10*3/uL (ref 4.0–10.3)
lymph#: 2.5 10*3/uL (ref 0.9–3.3)

## 2015-11-12 LAB — COMPREHENSIVE METABOLIC PANEL (CC13)
ALT: 13 U/L (ref 0–55)
AST: 17 U/L (ref 5–34)
Albumin: 3 g/dL — ABNORMAL LOW (ref 3.5–5.0)
Alkaline Phosphatase: 64 U/L (ref 40–150)
Anion Gap: 9 meq/L (ref 3–11)
BUN: 5.1 mg/dL — ABNORMAL LOW (ref 7.0–26.0)
CO2: 27 meq/L (ref 22–29)
Calcium: 8.4 mg/dL (ref 8.4–10.4)
Chloride: 101 meq/L (ref 98–109)
Creatinine: 0.7 mg/dL (ref 0.7–1.3)
EGFR: 88 ml/min/1.73 m2 — ABNORMAL LOW
Glucose: 116 mg/dL (ref 70–140)
Potassium: 3.4 meq/L — ABNORMAL LOW (ref 3.5–5.1)
Sodium: 137 meq/L (ref 136–145)
Total Bilirubin: 1.36 mg/dL — ABNORMAL HIGH (ref 0.20–1.20)
Total Protein: 5.9 g/dL — ABNORMAL LOW (ref 6.4–8.3)

## 2015-11-12 NOTE — Progress Notes (Signed)
  [facility] OFFICE PROGRESS NOTE   Diagnosis: Multiple myeloma  INTERVAL HISTORY:   [name] returns as scheduled. He began cycle 6 Revlimid/Decadron on 10/21/2015. No new complaint. Mild malaise. He saw neurology for the gait instability and is felt to have a peripheral neuropathy, potentially related to alcohol or myeloma.  Objective:  Vital signs in last 24 hours:  Blood pressure 122/41, pulse 57, temperature 98.2 F (36.8 C), temperature source Oral, resp. rate 18, height $RemoveBe'5\' 7"'LinATdCBt$  (1.702 m), weight 180 lb 14.4 oz (82.056 kg), SpO2 98 %.    HEENT: No thrush Resp: Lungs clear bilaterally Cardio: Regular rate and rhythm GI: No hepatosplenomegaly Vascular: 1+ pitting edema at the left greater than right lower leg   Lab Results:  Lab Results  Component Value Date   WBC 5.6 11/12/2015   HGB 12.8* 11/12/2015   HCT 37.7* 11/12/2015   MCV 98.6* 11/12/2015   PLT 174 11/12/2015   NEUTROABS 2.0 11/12/2015     Medications: I have reviewed the patient's current medications.  Assessment/Plan: 1. Multiple myeloma   Elevated IgG level with a monoclonal protein.. The monoclonal gammopathy dates to 2008   Bone marrow biopsy 05/23/2014 consistent with multiple myeloma, 59% plasma cells-kappa light chain restricted  Negative bone survey 05/31/2014  Initiation of Revlimid/Decadron 06/01/2015  Cycle 2 Revlimid/Decadron 06/29/2015  Cycle 3 Revlimid/Decadron 07/27/2015  Cycle 4 Revlimid/Decadron 08/26/2015  Cycle 5 Revlimid/Decadron 09/23/2015  Cycle 6 Revlimid/Decadron 10/21/2015 2. Red cell macrocytosis  3. Moderate alcohol use  4. "Neuropathy "of the feet -he describes "burning" of the feet for the past 20 years  5. Mild lymphocytosis -negative peripheral blood flow cytometry 12/27/2013 6. Mild Thrombocytopenia secondary to multiple myeloma -improved 7. Anemia secondary to multiple myeloma-improved 8. Neutropenia secondary to Revlimid 9.  Asymmetric leg edema-likely secondary to Decadron, negative bilateral lower extremity Doppler 07/24/2015    Disposition:  [name] appears stable. He has completed 6 cycles of Revlimid/Decadron. We will follow-up on the IgG and serum protein electrophoresis from today. The plan is to switch to maintenance Revlimid if there has been further improvement in the M spike.  He will begin Revlimid at a dose of 10 mg daily on 11/18/2015. [name] will return for an office and lab visit on 12/06/2015.  He will be relocating to [place] on 12/10/2015. His new home is a few minutes from an [employer]. We will make a referral to the [facility] clinic at [facility] for ongoing oncology care.  [name], MD  11/12/2015  1:51 PM

## 2015-11-12 NOTE — Telephone Encounter (Signed)
per pof to sch pt appt*gave pt copy of avs-sent Anne/mMelissa email to override appt @ 2:00 on 12/23 for 30 min

## 2015-11-13 ENCOUNTER — Telehealth: Payer: Self-pay | Admitting: *Deleted

## 2015-11-13 ENCOUNTER — Other Ambulatory Visit: Payer: Self-pay | Admitting: *Deleted

## 2015-11-13 MED ORDER — LENALIDOMIDE 10 MG PO CAPS: 10.0000 mg | ORAL_CAPSULE | Freq: Every day | ORAL | Status: DC

## 2015-11-13 NOTE — Telephone Encounter (Signed)
Per Dr. Benay Spice; contacted [facility] re: pt moving to [place] and needing to transfer care.  Registered pt in Multiple Myeloma clinic and requested medical records to send chart to fax# [phone] to Dr. Almyra Free.  [facility] phone# [phone]

## 2015-11-14 LAB — PROTEIN ELECTROPHORESIS, SERUM
Abnormal Protein Band1: 0.2 g/dL
Albumin ELP: 3.1 g/dL — ABNORMAL LOW (ref 3.8–4.8)
Alpha-1-Globulin: 0.3 g/dL (ref 0.2–0.3)
Alpha-2-Globulin: 0.7 g/dL (ref 0.5–0.9)
Beta 2: 0.3 g/dL (ref 0.2–0.5)
Beta Globulin: 0.4 g/dL (ref 0.4–0.6)
Gamma Globulin: 0.8 g/dL (ref 0.8–1.7)
Total Protein, Serum Electrophoresis: 5.6 g/dL — ABNORMAL LOW (ref 6.1–8.1)

## 2015-11-14 LAB — KAPPA/LAMBDA LIGHT CHAINS
KAPPA FREE LGHT CHN: 2.44 mg/dL — AB (ref 0.33–1.94)
KAPPA LAMBDA RATIO: 1.16 (ref 0.26–1.65)
Lambda Free Lght Chn: 2.1 mg/dL (ref 0.57–2.63)

## 2015-11-14 LAB — IGG: IGG (IMMUNOGLOBIN G), SERUM: 838 mg/dL (ref 650–1600)

## 2015-11-18 ENCOUNTER — Telehealth: Payer: Self-pay | Admitting: Oncology

## 2015-11-18 NOTE — Telephone Encounter (Signed)
Faxed records request to [facility]

## 2015-11-19 ENCOUNTER — Telehealth: Payer: Self-pay | Admitting: *Deleted

## 2015-11-19 NOTE — Telephone Encounter (Signed)
FYI  Voicemail from spouse Butch Penny: "[name] has grown weaker and weaker since starting Revlimid.  A few weeks ago his legs crumpled under him he couldn't get up.  We thought he was better but the last two nights he can't walk from the den to the kitchen.  We want Dr. Benay Spice to u nderstand this and secondly, we need to notify travel agency to use insurance on cruise we planned for 12-20-2015.  It will not be safe for him to go to take this trip.  Please call [phone]."  Call forwarded to [phone].

## 2015-11-19 NOTE — Telephone Encounter (Signed)
Message from pt's wife reporting pt has become "weaker and weaker while on the Revlimid." They need to cancel an upcoming cruise and may need a letter from Dr. Benay Spice stating pt is unable to travel. Will forward request to Dr. Benay Spice. Reviewed above with Dr. Benay Spice, in agreement with above. Informed wife, she will have travel agent send forms.

## 2015-12-02 ENCOUNTER — Other Ambulatory Visit: Payer: Self-pay | Admitting: Oncology

## 2015-12-05 ENCOUNTER — Encounter (INDEPENDENT_AMBULATORY_CARE_PROVIDER_SITE_OTHER): Payer: Medicare Other | Admitting: Ophthalmology

## 2015-12-05 DIAGNOSIS — H353211 Exudative age-related macular degeneration, right eye, with active choroidal neovascularization: Secondary | ICD-10-CM | POA: Diagnosis not present

## 2015-12-05 DIAGNOSIS — H43813 Vitreous degeneration, bilateral: Secondary | ICD-10-CM

## 2015-12-05 DIAGNOSIS — I10 Essential (primary) hypertension: Secondary | ICD-10-CM

## 2015-12-05 DIAGNOSIS — H35033 Hypertensive retinopathy, bilateral: Secondary | ICD-10-CM

## 2015-12-05 DIAGNOSIS — H353121 Nonexudative age-related macular degeneration, left eye, early dry stage: Secondary | ICD-10-CM | POA: Diagnosis not present

## 2015-12-06 ENCOUNTER — Telehealth: Payer: Self-pay | Admitting: *Deleted

## 2015-12-06 ENCOUNTER — Other Ambulatory Visit (HOSPITAL_BASED_OUTPATIENT_CLINIC_OR_DEPARTMENT_OTHER): Payer: Medicare Other

## 2015-12-06 ENCOUNTER — Ambulatory Visit (HOSPITAL_COMMUNITY)
Admission: RE | Admit: 2015-12-06 | Discharge: 2015-12-06 | Disposition: A | Discharge status: Home / Self Care | Payer: Medicare Other | Source: Ambulatory Visit | Attending: Oncology | Admitting: Oncology

## 2015-12-06 ENCOUNTER — Ambulatory Visit (HOSPITAL_BASED_OUTPATIENT_CLINIC_OR_DEPARTMENT_OTHER): Payer: Medicare Other | Admitting: Oncology

## 2015-12-06 VITALS — BP 126/51 | HR 56 | Temp 98.2°F | Resp 17 | Ht 67.0 in | Wt 176.7 lb

## 2015-12-06 DIAGNOSIS — C9000 Multiple myeloma not having achieved remission: Secondary | ICD-10-CM

## 2015-12-06 DIAGNOSIS — R609 Edema, unspecified: Secondary | ICD-10-CM | POA: Insufficient documentation

## 2015-12-06 DIAGNOSIS — R600 Localized edema: Secondary | ICD-10-CM

## 2015-12-06 DIAGNOSIS — C9001 Multiple myeloma in remission: Secondary | ICD-10-CM

## 2015-12-06 LAB — CBC WITH DIFFERENTIAL/PLATELET
BASO%: 1.3 % (ref 0.0–2.0)
BASOS ABS: 0.1 10*3/uL (ref 0.0–0.1)
EOS ABS: 0.1 10*3/uL (ref 0.0–0.5)
EOS%: 1.4 % (ref 0.0–7.0)
HCT: 36.3 % — ABNORMAL LOW (ref 38.4–49.9)
HEMOGLOBIN: 12.3 g/dL — AB (ref 13.0–17.1)
LYMPH%: 35 % (ref 14.0–49.0)
MCH: 34.3 pg — AB (ref 27.2–33.4)
MCHC: 34 g/dL (ref 32.0–36.0)
MCV: 101 fL — AB (ref 79.3–98.0)
MONO#: 1.3 10*3/uL — ABNORMAL HIGH (ref 0.1–0.9)
MONO%: 15.7 % — AB (ref 0.0–14.0)
NEUT#: 3.9 10*3/uL (ref 1.5–6.5)
NEUT%: 46.6 % (ref 39.0–75.0)
Platelets: 150 10*3/uL (ref 140–400)
RBC: 3.59 10*6/uL — AB (ref 4.20–5.82)
RDW: 16.1 % — AB (ref 11.0–14.6)
WBC: 8.4 10*3/uL (ref 4.0–10.3)
lymph#: 3 10*3/uL (ref 0.9–3.3)

## 2015-12-06 LAB — BASIC METABOLIC PANEL
ANION GAP: 17 meq/L — AB (ref 3–11)
BUN: 5.7 mg/dL — AB (ref 7.0–26.0)
CO2: 23 mEq/L (ref 22–29)
Calcium: 8 mg/dL — ABNORMAL LOW (ref 8.4–10.4)
Chloride: 97 mEq/L — ABNORMAL LOW (ref 98–109)
Creatinine: 0.7 mg/dL (ref 0.7–1.3)
EGFR: >90 mL/min/{1.73_m2} (ref >90)
Glucose: 97 mg/dl (ref 70–140)
POTASSIUM: 3.1 meq/L — AB (ref 3.5–5.1)
SODIUM: 137 meq/L (ref 136–145)

## 2015-12-06 MED ORDER — POTASSIUM CHLORIDE CRYS ER 20 MEQ PO TBCR: 20.0000 meq | EXTENDED_RELEASE_TABLET | Freq: Every day | ORAL | Status: AC

## 2015-12-06 NOTE — Telephone Encounter (Signed)
Spoke with [place] @ [facility] reports pt is NEG for DVT; some fluid noted.  Notified pt's wife of NEG results and instructions given to elevate legs tonight and call if any problems over weekend.   Wife verbalized understanding and expressed appreciation.

## 2015-12-06 NOTE — Progress Notes (Signed)
  [facility] OFFICE PROGRESS NOTE   Diagnosis:  Multiple myeloma  INTERVAL HISTORY:    Mr. Limones returns as scheduled. He reports malaise. He continues daily Revlimid. He has noted increased left greater than right lower leg edema. He has discomfort in the left pretibial area. He plans to relocate to [facility] on 12/09/2015. He took a trip to [place] approximately 2 weeks ago.  Objective:  Vital signs in last 24 hours:  Blood pressure 126/51, pulse 56, temperature 98.2 F (36.8 C), temperature source Oral, resp. rate 17, height '5\' 7"'$  (1.702 m), weight 176 lb 11.2 oz (80.151 kg), SpO2 98 %.    HEENT:  No thrush Resp:  Lungs clear bilaterally Cardio:  Regular rate and rhythm GI:  No hepatosplenomegaly  Vascular: pitting edema below the knee on the left greater than right with chronic stasis change at the left greater than right lower leg. Mild erythema at the pretibial area bilaterally.   Lab Results:  Lab Results  Component Value Date   WBC 8.4 12/06/2015   HGB 12.3* 12/06/2015   HCT 36.3* 12/06/2015   MCV 101.0* 12/06/2015   PLT 150 12/06/2015   NEUTROABS 3.9 12/06/2015   potassium 3.1, BUN 5.7, crit 0.7   serum protein pheresis 11/12/2015- M spike   0.2, IgG 838   Medications: I have reviewed the patient's current medications.  Assessment/Plan: 1. Multiple myeloma   Elevated IgG level with a monoclonal protein.. The monoclonal gammopathy dates to 2008   Bone marrow biopsy 05/23/2014 consistent with multiple myeloma, 59% plasma cells-kappa light chain restricted  Negative bone survey 05/31/2014  Initiation of Revlimid/Decadron 06/01/2015  Cycle 2 Revlimid/Decadron 06/29/2015  Cycle 3 Revlimid/Decadron 07/27/2015  Cycle 4 Revlimid/Decadron 08/26/2015  Cycle 5 Revlimid/Decadron 09/23/2015  Cycle 6 Revlimid/Decadron 10/21/2015   initiation of maintenance Revlimid at a dose of 10 mg daily 11/18/2015 2. Red cell macrocytosis  3. Moderate  alcohol use  4. "Neuropathy "of the feet -he describes "burning" of the feet for the past 20 years  5. Mild lymphocytosis -negative peripheral blood flow cytometry 12/27/2013 6. Mild Thrombocytopenia secondary to multiple myeloma -improved 7. Anemia secondary to multiple myeloma-improved 8.  History ofNeutropenia secondary to Revlimid 9. Asymmetric leg edema-likely secondary to Decadron, negative bilateral lower extremity Doppler 07/24/2015   Disposition:   Mr. Grandstaff has been maintained on maintenance Revlimid since 11/18/2015. He had a persistent low level serum M spike and mild elevation of the IgG level last month.   he will continue maintenance Revlimid. We increased the potassium dose today. He is scheduled for oncology and primary care appointments within the next few weeks.   He is at increased risk for deep vein thrombosis. We referred him for a Doppler of the lower extremities today.    Mr.Dragone is not scheduled for follow-up appointment at the Cancer center here. He is scheduled to be seen in [place] in approximately 2 weeks. We are available to see him in the future as needed.  [name], MD  12/06/2015  2:39 PM

## 2015-12-06 NOTE — Progress Notes (Signed)
VASCULAR LAB PRELIMINARY  PRELIMINARY  PRELIMINARY  PRELIMINARY  Bilateral lower extremity venous duplex completed.    Preliminary report:  Bilateral:  No evidence of DVT, superficial thrombosis, or Baker's Cyst. Mild to moderate interstitial fluid noted bilaterally in the lower leg.   Jalah Warmuth, RVS 12/06/2015, 4:40 PM

## 2015-12-10 ENCOUNTER — Telehealth: Payer: Self-pay | Admitting: Oncology

## 2015-12-10 NOTE — Telephone Encounter (Signed)
Doppler completed 12/23. Per 12/23 pof f/u tba. No other appointments.

## 2015-12-11 ENCOUNTER — Encounter: Payer: Self-pay | Admitting: Oncology

## 2015-12-11 NOTE — Progress Notes (Signed)
I placed [employer](insurance) form on desk of dr. Tomasa Hosteller. I entered in sharepoint also.

## 2015-12-31 ENCOUNTER — Encounter: Payer: Self-pay | Admitting: Oncology

## 2015-12-31 NOTE — Progress Notes (Signed)
I mailed form to [employer] with envelope the patient provided. I made copy for the patient also and mailed to him

## 2016-01-06 ENCOUNTER — Other Ambulatory Visit: Payer: Self-pay | Admitting: Oncology

## 2016-02-14 ENCOUNTER — Encounter: Payer: Self-pay | Admitting: Oncology

## 2016-02-14 NOTE — Progress Notes (Signed)
Wife is sending letter from travel agent- they need dr. Benay Spice to do a letter for a trip that was canceled and they are wanting to get their money back

## 2016-02-25 ENCOUNTER — Encounter: Payer: Self-pay | Admitting: Oncology

## 2016-02-25 NOTE — Progress Notes (Signed)
Left letter copy on desk for dr. Benay Spice to sign-he is out of office 02/25/16--needs to be mailed in envelope from patient

## 2016-02-26 ENCOUNTER — Encounter: Payer: Self-pay | Admitting: Oncology

## 2016-02-26 NOTE — Progress Notes (Signed)
Sent ltter to patient an claims dept for trip reimsbursement. Copy sent to medical records also

## 2016-05-01 ENCOUNTER — Encounter: Payer: Self-pay | Admitting: Oncology

## 2016-05-01 NOTE — Progress Notes (Signed)
Letter faxed i march i sent to medical records

## 2017-10-14 DEATH — deceased
